# Patient Record
Sex: Male | Born: 1950 | Race: White | Hispanic: No | Marital: Married | Smoking: Never smoker
Health system: Southern US, Community
[De-identification: ages and names within clinical notes are randomized; demographics above are authoritative.]

## PROBLEM LIST (undated history)

## (undated) DIAGNOSIS — Z87442 Personal history of urinary calculi: Secondary | ICD-10-CM

## (undated) DIAGNOSIS — K219 Gastro-esophageal reflux disease without esophagitis: Secondary | ICD-10-CM

## (undated) DIAGNOSIS — C439 Malignant melanoma of skin, unspecified: Secondary | ICD-10-CM

## (undated) DIAGNOSIS — G4700 Insomnia, unspecified: Secondary | ICD-10-CM

## (undated) DIAGNOSIS — N209 Urinary calculus, unspecified: Secondary | ICD-10-CM

## (undated) DIAGNOSIS — K5730 Diverticulosis of large intestine without perforation or abscess without bleeding: Secondary | ICD-10-CM

## (undated) DIAGNOSIS — N200 Calculus of kidney: Secondary | ICD-10-CM

## (undated) DIAGNOSIS — H269 Unspecified cataract: Secondary | ICD-10-CM

## (undated) DIAGNOSIS — N201 Calculus of ureter: Secondary | ICD-10-CM

## (undated) HISTORY — DX: Malignant melanoma of skin, unspecified: C43.9

## (undated) HISTORY — DX: Urinary calculus, unspecified: N20.9

## (undated) HISTORY — DX: Unspecified cataract: H26.9

## (undated) HISTORY — DX: Diverticulosis of large intestine without perforation or abscess without bleeding: K57.30

## (undated) HISTORY — PX: MELANOMA EXCISION: SHX5266

## (undated) HISTORY — DX: Gastro-esophageal reflux disease without esophagitis: K21.9

## (undated) HISTORY — PX: COLONOSCOPY: SHX174

## (undated) HISTORY — DX: Calculus of kidney: N20.0

## (undated) HISTORY — PX: NOSE SURGERY: SHX723

## (undated) HISTORY — DX: Insomnia, unspecified: G47.00

## (undated) HISTORY — DX: Calculus of ureter: N20.1

## (undated) HISTORY — DX: Personal history of urinary calculi: Z87.442

---

## 2003-06-09 DIAGNOSIS — C439 Malignant melanoma of skin, unspecified: Secondary | ICD-10-CM

## 2003-06-09 HISTORY — DX: Malignant melanoma of skin, unspecified: C43.9

## 2004-06-16 ENCOUNTER — Ambulatory Visit: Payer: Self-pay | Admitting: Internal Medicine

## 2004-12-12 ENCOUNTER — Ambulatory Visit: Payer: Self-pay | Admitting: Internal Medicine

## 2004-12-25 ENCOUNTER — Ambulatory Visit: Payer: Self-pay | Admitting: Internal Medicine

## 2005-01-09 ENCOUNTER — Ambulatory Visit: Payer: Self-pay | Admitting: Internal Medicine

## 2005-01-09 ENCOUNTER — Encounter: Payer: Self-pay | Admitting: Internal Medicine

## 2005-02-10 ENCOUNTER — Ambulatory Visit: Payer: Self-pay | Admitting: Internal Medicine

## 2007-01-14 ENCOUNTER — Ambulatory Visit: Payer: Self-pay | Admitting: Internal Medicine

## 2007-01-17 ENCOUNTER — Ambulatory Visit: Payer: Self-pay | Admitting: Internal Medicine

## 2007-01-17 LAB — CONVERTED CEMR LAB
AST: 21 units/L (ref 0–37)
Albumin: 4.1 g/dL (ref 3.5–5.2)
Basophils Absolute: 0 10*3/uL (ref 0.0–0.1)
Bilirubin Urine: NEGATIVE
Bilirubin, Direct: 0.1 mg/dL (ref 0.0–0.3)
Blood in Urine, dipstick: NEGATIVE
Calcium: 9.2 mg/dL (ref 8.4–10.5)
Chloride: 106 meq/L (ref 96–112)
Cholesterol: 188 mg/dL (ref 0–200)
Eosinophils Absolute: 0.1 10*3/uL (ref 0.0–0.6)
GFR calc Af Amer: 89 mL/min
GFR calc non Af Amer: 74 mL/min
Glucose, Urine, Semiquant: NEGATIVE
Ketones, urine, test strip: NEGATIVE
Lymphocytes Relative: 39.4 % (ref 12.0–46.0)
MCHC: 34.5 g/dL (ref 30.0–36.0)
MCV: 90.9 fL (ref 78.0–100.0)
Neutro Abs: 2.6 10*3/uL (ref 1.4–7.7)
Neutrophils Relative %: 48.2 % (ref 43.0–77.0)
PSA: 0.48 ng/mL (ref 0.10–4.00)
Platelets: 199 10*3/uL (ref 150–400)
Protein, U semiquant: NEGATIVE
RBC: 4.83 M/uL (ref 4.22–5.81)
Sodium: 142 meq/L (ref 135–145)
TSH: 2 microintl units/mL (ref 0.35–5.50)
Total CHOL/HDL Ratio: 3.1
Triglycerides: 54 mg/dL (ref 0–149)
pH: 6

## 2007-01-28 ENCOUNTER — Ambulatory Visit: Payer: Self-pay | Admitting: Internal Medicine

## 2007-01-28 DIAGNOSIS — K219 Gastro-esophageal reflux disease without esophagitis: Secondary | ICD-10-CM | POA: Insufficient documentation

## 2007-01-28 HISTORY — DX: Gastro-esophageal reflux disease without esophagitis: K21.9

## 2007-02-02 DIAGNOSIS — K5730 Diverticulosis of large intestine without perforation or abscess without bleeding: Secondary | ICD-10-CM | POA: Insufficient documentation

## 2007-02-02 HISTORY — DX: Diverticulosis of large intestine without perforation or abscess without bleeding: K57.30

## 2007-02-04 ENCOUNTER — Telehealth: Payer: Self-pay | Admitting: Internal Medicine

## 2007-06-17 ENCOUNTER — Telehealth: Payer: Self-pay | Admitting: Internal Medicine

## 2007-10-21 ENCOUNTER — Ambulatory Visit: Payer: Self-pay | Admitting: Internal Medicine

## 2007-10-21 LAB — CONVERTED CEMR LAB
ALT: 14 units/L (ref 0–53)
Basophils Absolute: 0 10*3/uL (ref 0.0–0.1)
Basophils Relative: 0.5 % (ref 0.0–1.0)
Bilirubin Urine: NEGATIVE
Bilirubin, Direct: 0.1 mg/dL (ref 0.0–0.3)
CO2: 30 meq/L (ref 19–32)
Calcium: 9.8 mg/dL (ref 8.4–10.5)
Cholesterol: 192 mg/dL (ref 0–200)
Creatinine, Ser: 1.1 mg/dL (ref 0.4–1.5)
GFR calc Af Amer: 89 mL/min
Glucose, Urine, Semiquant: NEGATIVE
HCT: 47.6 % (ref 39.0–52.0)
Hemoglobin: 15.6 g/dL (ref 13.0–17.0)
Lymphocytes Relative: 32.5 % (ref 12.0–46.0)
MCHC: 32.7 g/dL (ref 30.0–36.0)
MCV: 90.5 fL (ref 78.0–100.0)
Monocytes Absolute: 0.5 10*3/uL (ref 0.1–1.0)
Neutro Abs: 2.5 10*3/uL (ref 1.4–7.7)
PSA: 0.42 ng/mL (ref 0.10–4.00)
RBC: 5.27 M/uL (ref 4.22–5.81)
RDW: 15.1 % — ABNORMAL HIGH (ref 11.5–14.6)
Sodium: 142 meq/L (ref 135–145)
Specific Gravity, Urine: 1.025
TSH: 2.06 microintl units/mL (ref 0.35–5.50)
Total Bilirubin: 1.8 mg/dL — ABNORMAL HIGH (ref 0.3–1.2)
Total Protein: 6.8 g/dL (ref 6.0–8.3)
Triglycerides: 69 mg/dL (ref 0–149)
Urobilinogen, UA: 0.2
pH: 6

## 2007-11-18 ENCOUNTER — Ambulatory Visit: Payer: Self-pay | Admitting: Internal Medicine

## 2007-11-18 DIAGNOSIS — M25519 Pain in unspecified shoulder: Secondary | ICD-10-CM | POA: Insufficient documentation

## 2008-04-19 ENCOUNTER — Ambulatory Visit: Payer: Self-pay | Admitting: Cardiology

## 2008-04-19 ENCOUNTER — Ambulatory Visit: Payer: Self-pay | Admitting: Family Medicine

## 2008-04-19 ENCOUNTER — Telehealth: Payer: Self-pay | Admitting: Family Medicine

## 2008-04-19 DIAGNOSIS — N201 Calculus of ureter: Secondary | ICD-10-CM | POA: Insufficient documentation

## 2008-04-19 DIAGNOSIS — N200 Calculus of kidney: Secondary | ICD-10-CM | POA: Insufficient documentation

## 2008-04-19 DIAGNOSIS — N209 Urinary calculus, unspecified: Secondary | ICD-10-CM

## 2008-04-19 HISTORY — DX: Urinary calculus, unspecified: N20.9

## 2008-04-19 HISTORY — DX: Calculus of ureter: N20.1

## 2008-04-19 HISTORY — DX: Calculus of kidney: N20.0

## 2008-04-25 ENCOUNTER — Telehealth: Payer: Self-pay | Admitting: Internal Medicine

## 2008-07-16 ENCOUNTER — Telehealth: Payer: Self-pay | Admitting: Internal Medicine

## 2008-12-11 ENCOUNTER — Ambulatory Visit: Payer: Self-pay | Admitting: Internal Medicine

## 2008-12-11 LAB — CONVERTED CEMR LAB
ALT: 15 units/L (ref 0–53)
AST: 23 units/L (ref 0–37)
Alkaline Phosphatase: 35 units/L — ABNORMAL LOW (ref 39–117)
Basophils Absolute: 0 10*3/uL (ref 0.0–0.1)
Bilirubin, Direct: 0.1 mg/dL (ref 0.0–0.3)
CO2: 30 meq/L (ref 19–32)
Calcium: 9.6 mg/dL (ref 8.4–10.5)
Chloride: 107 meq/L (ref 96–112)
Direct LDL: 123.6 mg/dL
Eosinophils Absolute: 0.1 10*3/uL (ref 0.0–0.7)
Hemoglobin: 15.9 g/dL (ref 13.0–17.0)
Lymphocytes Relative: 37.9 % (ref 12.0–46.0)
MCHC: 34.8 g/dL (ref 30.0–36.0)
Neutro Abs: 2.3 10*3/uL (ref 1.4–7.7)
Nitrite: NEGATIVE
Platelets: 197 10*3/uL (ref 150.0–400.0)
Potassium: 4.4 meq/L (ref 3.5–5.1)
RDW: 12.2 % (ref 11.5–14.6)
Sodium: 143 meq/L (ref 135–145)
Specific Gravity, Urine: >=1.03
Total CHOL/HDL Ratio: 3
Total Protein: 6.8 g/dL (ref 6.0–8.3)
Urobilinogen, UA: 0.2
WBC Urine, dipstick: NEGATIVE

## 2008-12-28 ENCOUNTER — Ambulatory Visit: Payer: Self-pay | Admitting: Internal Medicine

## 2008-12-28 DIAGNOSIS — Z87442 Personal history of urinary calculi: Secondary | ICD-10-CM

## 2008-12-28 HISTORY — DX: Personal history of urinary calculi: Z87.442

## 2008-12-31 ENCOUNTER — Telehealth: Payer: Self-pay | Admitting: Internal Medicine

## 2009-12-11 ENCOUNTER — Encounter (INDEPENDENT_AMBULATORY_CARE_PROVIDER_SITE_OTHER): Payer: Self-pay | Admitting: *Deleted

## 2010-02-14 ENCOUNTER — Ambulatory Visit: Payer: Self-pay | Admitting: Internal Medicine

## 2010-02-14 LAB — CONVERTED CEMR LAB
ALT: 20 units/L (ref 0–53)
AST: 22 units/L (ref 0–37)
Albumin: 4.2 g/dL (ref 3.5–5.2)
Blood in Urine, dipstick: NEGATIVE
Eosinophils Relative: 1.1 % (ref 0.0–5.0)
GFR calc non Af Amer: 62.93 mL/min (ref >60)
HCT: 47.8 % (ref 39.0–52.0)
HDL: 60.9 mg/dL (ref >39.00)
Hemoglobin: 16.3 g/dL (ref 13.0–17.0)
Lymphs Abs: 2.2 10*3/uL (ref 0.7–4.0)
Monocytes Relative: 8.5 % (ref 3.0–12.0)
Neutro Abs: 2.7 10*3/uL (ref 1.4–7.7)
Nitrite: NEGATIVE
Potassium: 4.4 meq/L (ref 3.5–5.1)
Protein, U semiquant: NEGATIVE
Sodium: 143 meq/L (ref 135–145)
Specific Gravity, Urine: 1.025
TSH: 1.57 microintl units/mL (ref 0.35–5.50)
Total Bilirubin: 1.5 mg/dL — ABNORMAL HIGH (ref 0.3–1.2)
VLDL: 12.6 mg/dL (ref 0.0–40.0)
WBC Urine, dipstick: NEGATIVE
WBC: 5.4 10*3/uL (ref 4.5–10.5)

## 2010-02-28 ENCOUNTER — Ambulatory Visit: Payer: Self-pay | Admitting: Internal Medicine

## 2010-05-05 ENCOUNTER — Encounter (INDEPENDENT_AMBULATORY_CARE_PROVIDER_SITE_OTHER): Payer: Self-pay | Admitting: *Deleted

## 2010-06-19 ENCOUNTER — Encounter (INDEPENDENT_AMBULATORY_CARE_PROVIDER_SITE_OTHER): Payer: Self-pay | Admitting: *Deleted

## 2010-06-20 ENCOUNTER — Ambulatory Visit
Admission: RE | Admit: 2010-06-20 | Discharge: 2010-06-20 | Payer: Self-pay | Source: Home / Self Care | Attending: Internal Medicine | Admitting: Internal Medicine

## 2010-07-04 ENCOUNTER — Ambulatory Visit: Admit: 2010-07-04 | Payer: Self-pay | Admitting: Internal Medicine

## 2010-07-06 LAB — CONVERTED CEMR LAB
Glucose, Urine, Semiquant: NEGATIVE
Ketones, urine, test strip: NEGATIVE
Nitrite: NEGATIVE
WBC Urine, dipstick: NEGATIVE
pH: 7

## 2010-07-08 NOTE — Letter (Signed)
Summary: Colonoscopy Letter  [facility]  [address] [place], [place] [address]   Phone: [phone]  Fax: [phone]      December 11, 2009 MRN: [id number]   Robert Cowan [address] [place], [place]  [address]   Dear [name],   According to your medical record, it is time for you to schedule a Colonoscopy. The American Cancer Society recommends this procedure as a method to detect early colon cancer. Patients with a family history of colon cancer, or a personal history of colon polyps or inflammatory bowel disease are at increased risk.  This letter has beeen generated based on the recommendations made at the time of your procedure. If you feel that in your particular situation this may no longer apply, please contact our office.  Please call our office at [phone] to schedule this appointment or to update your records at your earliest convenience.  Thank you for cooperating with [place] to provide you with the very best care possible.   Sincerely,  Hedwig Morton. Juanda Chance, M.D.  [facility] [phone]

## 2010-07-08 NOTE — Letter (Signed)
Summary: Pre Visit Letter Revised  [facility]  [address] [place], [place] [address]   Phone: [phone]  Fax: [phone]        05/05/2010 MRN: [id number]   Robert Cowan [address] [place], [place]  [address]            Procedure Date:  July 04, 2010  Welcome to the Gastroenterology Division at [facility].    You are scheduled to see a nurse for your pre-procedure visit on June 20, 2010 at 8:00 A.M. on the 3rd floor at [facility], [address]  We ask that you try to arrive at our office 15 minutes prior to your appointment time to allow for check-in.  Please take a minute to review the attached form.  If you answer "Yes" to one or more of the questions on the first page, we ask that you call the person listed at your earliest opportunity.  If you answer "No" to all of the questions, please complete the rest of the form and bring it to your appointment.    Your nurse visit will consist of discussing your medical and surgical history, your immediate family medical history, and your medications.   If you are unable to list all of your medications on the form, please bring the medication bottles to your appointment and we will list them.  We will need to be aware of both prescribed and over the counter drugs.  We will need to know exact dosage information as well.    Please be prepared to read and sign documents such as consent forms, a financial agreement, and acknowledgement forms.  If necessary, and with your consent, a friend or relative is welcome to sit-in on the nurse visit with you.  Please bring your insurance card so that we may make a copy of it.  If your insurance requires a referral to see a specialist, please bring your referral form from your primary care physician.  No co-pay is required for this nurse visit.     If you cannot keep your appointment, please call [phone] to cancel or reschedule prior to your appointment  date.  This allows [place] the opportunity to schedule an appointment for another patient in need of care.    Thank you for choosing [facility] for your medical needs.  We appreciate the opportunity to care for you.  Please visit [place] at our website  to learn more about our practice.  Sincerely, The Gastroenterology Division

## 2010-07-08 NOTE — Assessment & Plan Note (Signed)
Summary: CPX/CJR   Vital Signs:  Patient profile:   60 year old male Height:      68.5 inches Weight:      179 pounds BMI:     26.92 Temp:     98.2 degrees F oral BP sitting:   110 / 70  (right arm) Cuff size:   regular  Vitals Entered By: Duard Brady LPN (February 28, 2010 1:11 PM) CC: cpx - doing well has medication questions Is Patient Diabetic? No   CC:  cpx - doing well has medication questions.  History of Present Illness: 60 year old patient who is seen today for a wellness exam.  Medical problems include nephrolithiasis diverticulosis and gastroesophageal reflux disease.  He has chronic insomnia.  He has been using Tylenol PM for some time with diminishing effectiveness.  He is scheduled for follow-up colonoscopy in the near future  Allergies: 1)  ! Tetracycline Hcl (Tetracycline Hcl)  Past History:  Past Medical History: GERD Diverticulosis, colon ED Melanoma Nephrolithiasis, hx of Insomnia family history of colon cancer  Past Surgical History: Reviewed history from 11/18/2007 and no changes required. Mohs surgery for melanoma in situ, left Malar area, March 2006 resection dysplastic nevus, right anterior chest wall, June 2009  Colonoscopy August 2006  Family History: Reviewed history from 11/18/2007 and no changes required. mother died at 11 of complications of colon cancer father died of suicide death of several years later.  two brothers are well  Social History: Reviewed history from 12/28/2008 and no changes required. Single Regular exercise-yes  Review of Systems  The patient denies anorexia, fever, weight loss, weight gain, vision loss, decreased hearing, hoarseness, chest pain, syncope, dyspnea on exertion, peripheral edema, prolonged cough, headaches, hemoptysis, abdominal pain, melena, hematochezia, severe indigestion/heartburn, hematuria, incontinence, genital sores, muscle weakness, suspicious skin lesions, transient blindness,  difficulty walking, depression, unusual weight change, abnormal bleeding, enlarged lymph nodes, angioedema, breast masses, and testicular masses.    Physical Exam  General:  Well-developed,well-nourished,in no acute distress; alert,appropriate and cooperative throughout examination Head:  Normocephalic and atraumatic without obvious abnormalities. No apparent alopecia or balding. Eyes:  No corneal or conjunctival inflammation noted. EOMI. Perrla. Funduscopic exam benign, without hemorrhages, exudates or papilledema. Vision grossly normal. Ears:  External ear exam shows no significant lesions or deformities.  Otoscopic examination reveals clear canals, tympanic membranes are intact bilaterally without bulging, retraction, inflammation or discharge. Hearing is grossly normal bilaterally. Nose:  External nasal examination shows no deformity or inflammation. Nasal mucosa are pink and moist without lesions or exudates. Mouth:  Oral mucosa and oropharynx without lesions or exudates.  Teeth in good repair. Neck:  No deformities, masses, or tenderness noted. Chest Wall:  No deformities, masses, tenderness or gynecomastia noted. Lungs:  Normal respiratory effort, chest expands symmetrically. Lungs are clear to auscultation, no crackles or wheezes. Heart:  Normal rate and regular rhythm. S1 and S2 normal without gallop, murmur, click, rub or other extra sounds. Abdomen:  Bowel sounds positive,abdomen soft and non-tender without masses, organomegaly or hernias noted. Rectal:  No external abnormalities noted. Normal sphincter tone. No rectal masses or tenderness. Genitalia:  Testes bilaterally descended without nodularity, tenderness or masses. No scrotal masses or lesions. No penis lesions or urethral discharge. Prostate:  Prostate gland firm and smooth, no enlargement, nodularity, tenderness, mass, asymmetry or induration. Msk:  No deformity or scoliosis noted of thoracic or lumbar spine.   Pulses:  R and L  carotid,radial,femoral,dorsalis pedis and posterior tibial pulses are full and equal bilaterally Extremities:  No clubbing, cyanosis, edema, or deformity noted with normal full range of motion of all joints.   Neurologic:  No cranial nerve deficits noted. Station and gait are normal. Plantar reflexes are down-going bilaterally. DTRs are symmetrical throughout. Sensory, motor and coordinative functions appear intact. Skin:  Intact without suspicious lesions or rashes Cervical Nodes:  No lymphadenopathy noted Axillary Nodes:  No palpable lymphadenopathy Inguinal Nodes:  No significant adenopathy Psych:  Cognition and judgment appear intact. Alert and cooperative with normal attention span and concentration. No apparent delusions, illusions, hallucinations   Impression & Recommendations:  Problem # 1:  PREVENTIVE HEALTH CARE (ICD-V70.0)  Complete Medication List: 1)  Tylenol Pm Extra Strength 500-25 Mg Tabs (Diphenhydramine-apap (sleep)) .... Take 1 tablet by mouth at bedtime 2)  Aspirin 325 Mg Tbec (Aspirin) .... Once daily 3)  Multivitamins Caps (Multiple vitamin) .... Once daily 4)  Nexium 40 Mg Cpdr (Esomeprazole magnesium) .Marland Kitchen.. 1 once daily 5)  Cialis 20 Mg Tabs (Tadalafil) 6)  Acyclovir 200 Mg Caps (Acyclovir) .... As dir 7)  Lorazepam 0.5 Mg Tabs (Lorazepam) .... One at bedtime as needed for sleep  Patient Instructions: 1)  Please schedule a follow-up appointment in 1 year. 2)  Avoid foods high in acid (tomatoes, citrus juices, spicy foods). Avoid eating within two hours of lying down or before exercising. Do not over eat; try smaller more frequent meals. Elevate head of bed twelve inches when sleeping. 3)  It is important that you exercise regularly at least 20 minutes 5 times a week. If you develop chest pain, have severe difficulty breathing, or feel very tired , stop exercising immediately and seek medical attention. Prescriptions: LORAZEPAM 0.5 MG TABS (LORAZEPAM) one at bedtime  as needed for sleep  #60 x 4   Entered and Authorized by:   Gordy Savers  MD   Signed by:   Gordy Savers  MD on 02/28/2010   Method used:   Print then Give to Patient   RxID:   [id number] ACYCLOVIR 200 MG  CAPS (ACYCLOVIR) as dir  #90 x 6   Entered and Authorized by:   Gordy Savers  MD   Signed by:   Gordy Savers  MD on 02/28/2010   Method used:   [id number]       [address]       [place], [place]  [address]       Ph: [phone] or [phone]       Fax: [phone]   RxID:   [id number] CIALIS 20 MG  TABS (TADALAFIL)   #18 x 6   Entered and Authorized by:   Gordy Savers  MD   Signed by:   Gordy Savers  MD on 02/28/2010   Method used:   Print then Give to Patient   RxID:   [id number] NEXIUM 40 MG  CPDR (ESOMEPRAZOLE MAGNESIUM) 1 once daily  #90 x 6   Entered and Authorized by:   Gordy Savers  MD   Signed by:   Gordy Savers  MD on 02/28/2010   Method used:   [facility]        [id number]       [address]       [place], [place]  [address]  Ph: [phone] or [phone]       Fax: [phone]   RxID:   [id number]

## 2010-07-10 NOTE — Letter (Signed)
Summary: [facility] Instructions  [facility]  [address] [place], [place] [address]   Phone: [phone]  Fax: [phone]       Robert Cowan    August 16, 1950    MRN: [id number]       Procedure Day /Date:  Friday 07/04/2010     Arrival Time: 7:30 am     Procedure Time: 8:00 am     Location of Procedure:                    _x _   [facility] (4th Floor)   PREPARATION FOR COLONOSCOPY WITH MIRALAX  Starting 5 days prior to your procedure Sunday 1/22 do not eat nuts, seeds, popcorn, corn, beans, peas,  salads, or any raw vegetables.  Do not take any fiber supplements (e.g. Metamucil, Citrucel, and Benefiber). ____________________________________________________________________________________________________   THE DAY BEFORE YOUR PROCEDURE         DATE: Thursday 1/26  1   Drink clear liquids the entire day-NO SOLID FOOD  2   Do not drink anything colored red or purple.  Avoid juices with pulp.  No orange juice.  3   Drink at least 64 oz. (8 glasses) of fluid/clear liquids during the day to prevent dehydration and help the prep work efficiently.  CLEAR LIQUIDS INCLUDE: Water Jello Ice Popsicles Tea (sugar ok, no milk/cream) Powdered fruit flavored drinks Coffee (sugar ok, no milk/cream) Gatorade Juice: apple, white grape, white cranberry  Lemonade Clear bullion, consomm, broth Carbonated beverages (any kind) Strained chicken noodle soup Hard Candy  4   Mix the entire bottle of Miralax with 64 oz. of Gatorade/Powerade in the morning and put in the refrigerator to chill.  5   At 3:00 pm take 2 Dulcolax/Bisacodyl tablets.  6   At 4:30 pm take one Reglan/Metoclopramide tablet.  7  Starting at 5:00 pm drink one 8 oz glass of the Miralax mixture every 15-20 minutes until you have finished drinking the entire 64 oz.  You should finish drinking prep around 7:30 or 8:00 pm.  8   If you are nauseated, you may take the 2nd Reglan/Metoclopramide tablet  at 6:30 pm.        9    At 8:00 pm take 2 more DULCOLAX/Bisacodyl tablets.     THE DAY OF YOUR PROCEDURE      DATE:  Friday 1/27 You may drink clear liquids until 6:00 am  (2 HOURS BEFORE PROCEDURE).   MEDICATION INSTRUCTIONS  Unless otherwise instructed, you should take regular prescription medications with a small sip of water as early as possible the morning of your procedure.         OTHER INSTRUCTIONS  You will need a responsible adult at least 59 years of age to accompany you and drive you home.   This person must remain in the waiting room during your procedure.  Wear loose fitting clothing that is easily removed.  Leave jewelry and other valuables at home.  However, you may wish to bring a book to read or an iPod/MP3 player to listen to music as you wait for your procedure to start.  Remove all body piercing jewelry and leave at home.  Total time from sign-in until discharge is approximately 2-3 hours.  You should go home directly after your procedure and rest.  You can resume normal activities the day after your procedure.  The day of your procedure you should not:   Drive   Make legal decisions  Operate machinery   Drink alcohol   Return to work  You will receive specific instructions about eating, activities and medications before you leave.   The above instructions have been reviewed and explained to me by  Sherren Kerns RN  June 20, 2010 8:00 AM      I fully understand and can verbalize these instructions _____________________________ Date _______

## 2010-07-10 NOTE — Miscellaneous (Signed)
Summary: previsit prep/rm  Clinical Lists Changes  Medications: Added new medication of MIRALAX   POWD (POLYETHYLENE GLYCOL 3350) As per prep  instructions. - Signed Added new medication of DULCOLAX 5 MG  TBEC (BISACODYL) Day before procedure take 2 at 3pm and 2 at 8pm. - Signed Added new medication of REGLAN 10 MG  TABS (METOCLOPRAMIDE HCL) As per prep instructions. - Signed Rx of MIRALAX   POWD (POLYETHYLENE GLYCOL 3350) As per prep  instructions.;  #255gm x 0;  Signed;  Entered by: Sherren Kerns RN;  Authorized by: Hart Carwin MD;  Method used: [id number] [address] [place], [place]  [address], Ph: [phone] or [phone], Fax: [phone] Rx of DULCOLAX 5 MG  TBEC (BISACODYL) Day before procedure take 2 at 3pm and 2 at 8pm.;  #4 x 0;  Signed;  Entered by: Sherren Kerns RN;  Authorized by: Hart Carwin MD;  Method used: [facility] [id number] [address], [place], [place]  [address], Ph: [phone] or [phone], Fax: [phone] Rx of REGLAN 10 MG  TABS (METOCLOPRAMIDE HCL) As per prep instructions.;  #2 x 0;  Signed;  Entered by: Sherren Kerns RN;  Authorized by: Hart Carwin MD;  Method used: [id number] [address] [place], [place]  [address], Ph: [phone] or [phone], Fax: [phone] Observations: Added new observation of ALLERGY REV: Done (06/20/2010 7:44)    Prescriptions: REGLAN 10 MG  TABS (METOCLOPRAMIDE HCL) As per prep instructions.  #2 x 0   Entered by:   Sherren Kerns RN   Authorized by:   Hart Carwin MD   Signed by:   Sherren Kerns RN on 06/20/2010   Method used:   [facility]        [id number]       [address]       [place], [place]  [address]       Ph: [phone] or [phone]       Fax: [phone]   RxID:   [id number] DULCOLAX 5 MG  TBEC (BISACODYL) Day before  procedure take 2 at 3pm and 2 at 8pm.  #4 x 0   Entered by:   Sherren Kerns RN   Authorized by:   Hart Carwin MD   Signed by:   Sherren Kerns RN on 06/20/2010   Method used:   [facility]        [id number]       [address]       [place], [place]  [address]       Ph: [phone] or [phone]       Fax: [phone]   RxID:   [id number] MIRALAX   POWD (POLYETHYLENE GLYCOL 3350) As per prep  instructions.  #255gm x 0   Entered by:   Sherren Kerns RN   Authorized by:   Hart Carwin MD   Signed by:   Sherren Kerns RN on 06/20/2010   Method used:   [facility]        [id number]       [address]       [place], [place]  [address]  Ph: [phone] or [phone]       Fax: [phone]   RxID:   [id number]

## 2010-10-03 ENCOUNTER — Other Ambulatory Visit: Payer: Self-pay | Admitting: Internal Medicine

## 2010-10-03 MED ORDER — LORAZEPAM 0.5 MG PO TABS: 0.5000 mg | ORAL_TABLET | Freq: Every evening | ORAL | Status: DC | PRN

## 2010-10-03 NOTE — Telephone Encounter (Signed)
Pt needs refill lorazepam .5mg  [facility] [address] [phone]. Pt needs med by this Sunday 10-05-2010.

## 2010-10-03 NOTE — Telephone Encounter (Signed)
Called into doctor's line at [facility]

## 2010-10-30 ENCOUNTER — Ambulatory Visit (AMBULATORY_SURGERY_CENTER): Payer: No Typology Code available for payment source | Admitting: *Deleted

## 2010-10-30 VITALS — Ht 69.0 in | Wt 180.0 lb

## 2010-10-30 DIAGNOSIS — Z800 Family history of malignant neoplasm of digestive organs: Secondary | ICD-10-CM

## 2010-11-13 ENCOUNTER — Ambulatory Visit (AMBULATORY_SURGERY_CENTER): Payer: No Typology Code available for payment source | Admitting: Internal Medicine

## 2010-11-13 ENCOUNTER — Encounter: Payer: Self-pay | Admitting: Internal Medicine

## 2010-11-13 VITALS — BP 133/86 | HR 73 | Temp 96.9°F | Resp 17 | Ht 69.0 in | Wt 172.0 lb

## 2010-11-13 DIAGNOSIS — Z800 Family history of malignant neoplasm of digestive organs: Secondary | ICD-10-CM

## 2010-11-13 DIAGNOSIS — Z1211 Encounter for screening for malignant neoplasm of colon: Secondary | ICD-10-CM

## 2010-11-13 DIAGNOSIS — K5730 Diverticulosis of large intestine without perforation or abscess without bleeding: Secondary | ICD-10-CM

## 2010-11-13 MED ORDER — SODIUM CHLORIDE 0.9 % IV SOLN: 500.0000 mL | INTRAVENOUS | Status: DC

## 2010-11-13 NOTE — Progress Notes (Signed)
Pressure applied to abdomen to reach cecum.  

## 2010-11-14 ENCOUNTER — Telehealth: Payer: Self-pay

## 2010-11-14 NOTE — Telephone Encounter (Signed)

## 2011-01-26 ENCOUNTER — Other Ambulatory Visit: Payer: Self-pay | Admitting: Internal Medicine

## 2011-01-26 MED ORDER — LORAZEPAM 0.5 MG PO TABS: 0.5000 mg | ORAL_TABLET | Freq: Every evening | ORAL | Status: DC | PRN

## 2011-01-26 NOTE — Telephone Encounter (Signed)
Pt req refill for LORazepam (ATIVAN) 0.5 MG tablet . Pls call in to [facility] [address]

## 2011-01-26 NOTE — Telephone Encounter (Signed)
[facility] will need to be seen for future refills - last seen 02/2010

## 2011-02-24 ENCOUNTER — Other Ambulatory Visit (INDEPENDENT_AMBULATORY_CARE_PROVIDER_SITE_OTHER): Payer: No Typology Code available for payment source

## 2011-02-24 DIAGNOSIS — Z0000 Encounter for general adult medical examination without abnormal findings: Secondary | ICD-10-CM

## 2011-02-24 LAB — PSA: PSA: 0.6 ng/mL (ref 0.10–4.00)

## 2011-02-24 LAB — POCT URINALYSIS DIPSTICK
Bilirubin, UA: NEGATIVE
Blood, UA: NEGATIVE
Glucose, UA: NEGATIVE
Ketones, UA: NEGATIVE
Leukocytes, UA: NEGATIVE
Nitrite, UA: NEGATIVE
Protein, UA: NEGATIVE
Spec Grav, UA: 1.025
Urobilinogen, UA: 0.2
pH, UA: 6

## 2011-02-24 LAB — BASIC METABOLIC PANEL
GFR: 71.92 mL/min (ref >60.00)
Glucose, Bld: 98 mg/dL (ref 70–99)
Potassium: 4.5 mEq/L (ref 3.5–5.1)
Sodium: 142 mEq/L (ref 135–145)

## 2011-02-24 LAB — HEPATIC FUNCTION PANEL
ALT: 20 U/L (ref 0–53)
AST: 26 U/L (ref 0–37)
Albumin: 4.2 g/dL (ref 3.5–5.2)
Alkaline Phosphatase: 49 U/L (ref 39–117)
Bilirubin, Direct: 0.2 mg/dL (ref 0.0–0.3)
Total Bilirubin: 1.2 mg/dL (ref 0.3–1.2)
Total Protein: 6.7 g/dL (ref 6.0–8.3)

## 2011-02-24 LAB — CBC WITH DIFFERENTIAL/PLATELET
Basophils Relative: 0.6 % (ref 0.0–3.0)
Eosinophils Relative: 1.4 % (ref 0.0–5.0)
HCT: 48.4 % (ref 39.0–52.0)
Hemoglobin: 16.1 g/dL (ref 13.0–17.0)
Lymphs Abs: 2 10*3/uL (ref 0.7–4.0)
Monocytes Relative: 8.6 % (ref 3.0–12.0)
Neutro Abs: 2.5 10*3/uL (ref 1.4–7.7)
RBC: 5.18 Mil/uL (ref 4.22–5.81)
WBC: 5 10*3/uL (ref 4.5–10.5)

## 2011-02-24 LAB — LIPID PANEL
Cholesterol: 216 mg/dL — ABNORMAL HIGH (ref 0–200)
HDL: 62 mg/dL
Total CHOL/HDL Ratio: 3
Triglycerides: 66 mg/dL (ref 0.0–149.0)
VLDL: 13.2 mg/dL (ref 0.0–40.0)

## 2011-02-24 LAB — TSH: TSH: 1.9 u[IU]/mL (ref 0.35–5.50)

## 2011-02-24 LAB — LDL CHOLESTEROL, DIRECT: Direct LDL: 136.2 mg/dL

## 2011-02-27 ENCOUNTER — Telehealth: Payer: Self-pay | Admitting: *Deleted

## 2011-02-27 NOTE — Telephone Encounter (Signed)
Pt has had 2 days of fatigue and low grade fever.  Appt to see Dr. Kirtland Bouchard next week for CPX.   Spoke to pt, and he prefers to wait until next week to see Dr. Kirtland Bouchard unless he becomes worse.  Basically, his complaints are fatigue, ? Low grade fever, and body aches.  Will call the Saturday clinic if he feels worse tomorrow.

## 2011-03-02 ENCOUNTER — Encounter: Payer: Self-pay | Admitting: Internal Medicine

## 2011-03-02 NOTE — Telephone Encounter (Signed)
noted 

## 2011-03-03 ENCOUNTER — Ambulatory Visit (INDEPENDENT_AMBULATORY_CARE_PROVIDER_SITE_OTHER): Payer: No Typology Code available for payment source | Admitting: Internal Medicine

## 2011-03-03 ENCOUNTER — Encounter: Payer: Self-pay | Admitting: Internal Medicine

## 2011-03-03 VITALS — BP 112/70 | HR 70 | Temp 98.1°F | Resp 16 | Ht 69.5 in | Wt 183.0 lb

## 2011-03-03 DIAGNOSIS — Z0000 Encounter for general adult medical examination without abnormal findings: Secondary | ICD-10-CM

## 2011-03-03 MED ORDER — LORAZEPAM 0.5 MG PO TABS: 0.5000 mg | ORAL_TABLET | Freq: Every evening | ORAL | Status: DC | PRN

## 2011-03-03 NOTE — Patient Instructions (Signed)
It is important that you exercise regularly, at least 20 minutes 3 to 4 times per week.  If you develop chest pain or shortness of breath seek  medical attention.  Return in one year for follow-up   

## 2011-03-03 NOTE — Progress Notes (Signed)
Subjective:    Patient ID: Robert Cowan, male    DOB: 05/26/1951, 60 y.o.   MRN: [id number]  HPI  60 year old patient who is seen today for a preventive health examination. He has a history of mild diverticular disease and family history of colon cancer. He has had a followup colonoscopy in June of this year. He has a history of insomnia gastroesophageal reflux disease and has done quite well. He also has a history of nephrolithiasis which has been asymptomatic. No other concerns or complaints. Laboratory studies were reviewed  Allergies:  1) ! Tetracycline Hcl (Tetracycline Hcl)   Past History:  Past Medical History:  GERD  Diverticulosis, colon  ED  Melanoma  Nephrolithiasis, hx of  Insomnia  family history of colon cancer   Past Surgical History:   Reviewed history from 11/18/2007 and no changes required.  Mohs surgery for melanoma in situ, left Malar area, March 2006  resection dysplastic nevus, right anterior chest wall, June 2009  Colonoscopy August 2006  2012   Family History:  Reviewed history from 11/18/2007 and no changes required.  mother died at 68 of complications of colon cancer  father died of suicide death of several years later.  two brothers are well   Social History:  Reviewed history from 12/28/2008 and no changes required.  Single  Regular exercise-yes     Review of Systems  Constitutional: Negative for fever, chills, activity change, appetite change and fatigue.  HENT: Negative for hearing loss, ear pain, congestion, rhinorrhea, sneezing, mouth sores, trouble swallowing, neck pain, neck stiffness, dental problem, voice change, sinus pressure and tinnitus.   Eyes: Negative for photophobia, pain, redness and visual disturbance.  Respiratory: Negative for apnea, cough, choking, chest tightness, shortness of breath and wheezing.   Cardiovascular: Negative for chest pain, palpitations and leg swelling.  Gastrointestinal: Negative for nausea, vomiting,  abdominal pain, diarrhea, constipation, blood in stool, abdominal distention, anal bleeding and rectal pain.  Genitourinary: Negative for dysuria, urgency, frequency, hematuria, flank pain, decreased urine volume, discharge, penile swelling, scrotal swelling, difficulty urinating, genital sores and testicular pain.  Musculoskeletal: Negative for myalgias, back pain, joint swelling, arthralgias and gait problem.  Skin: Negative for color change, rash and wound.  Neurological: Negative for dizziness, tremors, seizures, syncope, facial asymmetry, speech difficulty, weakness, light-headedness, numbness and headaches.  Hematological: Negative for adenopathy. Does not bruise/bleed easily.  Psychiatric/Behavioral: Negative for suicidal ideas, hallucinations, behavioral problems, confusion, sleep disturbance, self-injury, dysphoric mood, decreased concentration and agitation. The patient is not nervous/anxious.        Objective:   Physical Exam  Constitutional: He appears well-developed and well-nourished.  HENT:  Head: Normocephalic and atraumatic.  Right Ear: External ear normal.  Left Ear: External ear normal.  Nose: Nose normal.  Mouth/Throat: Oropharynx is clear and moist.  Eyes: Conjunctivae and EOM are normal. Pupils are equal, round, and reactive to light. No scleral icterus.  Neck: Normal range of motion. Neck supple. No JVD present. No thyromegaly present.  Cardiovascular: Regular rhythm, normal heart sounds and intact distal pulses.  Exam reveals no gallop and no friction rub.   No murmur heard. Pulmonary/Chest: Effort normal and breath sounds normal. He exhibits no tenderness.  Abdominal: Soft. Bowel sounds are normal. He exhibits no distension and no mass. There is no tenderness.  Genitourinary: Prostate normal and penis normal.  Musculoskeletal: Normal range of motion. He exhibits no edema and no tenderness.  Lymphadenopathy:    He has no cervical adenopathy.  Neurological: He is  alert. He has normal reflexes. No cranial nerve deficit. Coordination normal.  Skin: Skin is warm and dry. No rash noted.  Psychiatric: He has a normal mood and affect. His behavior is normal.          Assessment & Plan:    Preventive health examination Nephrolithiasis Gastroesophageal reflux disease  A more regular vigorous exercise regimen encouraged. Medical regimen unchanged. Will recheck in one year or as needed

## 2011-07-03 ENCOUNTER — Other Ambulatory Visit: Payer: Self-pay

## 2011-07-03 MED ORDER — ACYCLOVIR 200 MG PO CAPS: ORAL_CAPSULE | ORAL | Status: DC

## 2011-07-03 NOTE — Telephone Encounter (Signed)
Rx sent to pharmacy electronically.   

## 2011-09-16 ENCOUNTER — Other Ambulatory Visit: Payer: Self-pay

## 2011-09-16 ENCOUNTER — Telehealth: Payer: Self-pay | Admitting: *Deleted

## 2011-09-16 MED ORDER — LORAZEPAM 0.5 MG PO TABS: 0.5000 mg | ORAL_TABLET | Freq: Every evening | ORAL | Status: DC | PRN

## 2011-09-16 NOTE — Telephone Encounter (Signed)
Noted - med was called in this AM

## 2011-09-16 NOTE — Telephone Encounter (Signed)
Called in.

## 2011-09-16 NOTE — Telephone Encounter (Signed)
Pt scheduled with Dr. Clent Ridges tomorrow for abdominal pain as Dr. Kirtland Bouchard is not here the rest of the week, and he could not get here in time today. No cardiac symptoms, but advised what to do if he develops any symptoms of heart attack.  He prefers to wait until tomorrow to see MD in the office.  He also needs his Ativan refilled, and states there should be a fax waiting here.

## 2011-09-17 ENCOUNTER — Encounter: Payer: Self-pay | Admitting: Family Medicine

## 2011-09-17 ENCOUNTER — Ambulatory Visit (INDEPENDENT_AMBULATORY_CARE_PROVIDER_SITE_OTHER): Payer: No Typology Code available for payment source | Admitting: Family Medicine

## 2011-09-17 VITALS — BP 132/84 | HR 84 | Temp 98.4°F | Wt 175.0 lb

## 2011-09-17 DIAGNOSIS — R1013 Epigastric pain: Secondary | ICD-10-CM

## 2011-09-17 NOTE — Progress Notes (Signed)
  Subjective:    Patient ID: Robert Cowan, male    DOB: March 24, 1951, 61 y.o.   MRN: [id number]  HPI Here for 3 days of epigastric pains. He has never felt these before. They started one evening shortly after dinner, and this was the worst of the pains. They have persisted since then, but they are steadily getting better. Today he has only slight epigastric discomfort. He has had no fever or nausea at all. He feels the need to belch at times. Urinations and BMs are normal. He has a hx of GERD, but this rarely bothers him. He uses Nexium about once every few months, but he has taken this daily for the past 3 days.    Review of Systems  Constitutional: Negative.   Respiratory: Negative.   Cardiovascular: Negative.   Gastrointestinal: Positive for abdominal pain. Negative for nausea, vomiting, diarrhea, constipation, blood in stool and abdominal distention.       Objective:   Physical Exam  Constitutional: He appears well-developed and well-nourished. No distress.  Cardiovascular: Normal rate, regular rhythm, normal heart sounds and intact distal pulses.   Pulmonary/Chest: Effort normal and breath sounds normal.  Abdominal: Soft. He exhibits no distension and no mass. There is no rebound and no guarding.       Slightly tender in the epigastrium           Assessment & Plan:  This is most likely some duodenitis, so I advised him to continue with a daily Nexium for several weeks. We will set up an [place] to rule out gall bladder problems

## 2011-09-25 ENCOUNTER — Ambulatory Visit
Admission: RE | Admit: 2011-09-25 | Discharge: 2011-09-25 | Disposition: A | Discharge status: Home / Self Care | Payer: No Typology Code available for payment source | Source: Ambulatory Visit | Attending: Family Medicine | Admitting: Family Medicine

## 2011-09-25 DIAGNOSIS — R1013 Epigastric pain: Secondary | ICD-10-CM

## 2011-09-25 NOTE — Progress Notes (Signed)
Quick Note:  Left message with normal results. ______ 

## 2011-12-11 ENCOUNTER — Other Ambulatory Visit: Payer: Self-pay | Admitting: Internal Medicine

## 2011-12-14 ENCOUNTER — Other Ambulatory Visit: Payer: Self-pay | Admitting: Internal Medicine

## 2011-12-14 NOTE — Telephone Encounter (Signed)
Patient called stating that he need a refill of his cialis sent to [facility] on [address]. Please assist.

## 2011-12-14 NOTE — Telephone Encounter (Signed)
Called and spoke with sara at [facility] - they have rx I sent on friday

## 2012-01-06 ENCOUNTER — Other Ambulatory Visit: Payer: Self-pay

## 2012-01-06 MED ORDER — LORAZEPAM 0.5 MG PO TABS: 0.5000 mg | ORAL_TABLET | Freq: Every evening | ORAL | Status: DC | PRN

## 2012-02-11 ENCOUNTER — Telehealth: Payer: Self-pay | Admitting: Internal Medicine

## 2012-02-11 MED ORDER — LORAZEPAM 0.5 MG PO TABS: 0.5000 mg | ORAL_TABLET | Freq: Every evening | ORAL | Status: DC | PRN

## 2012-02-11 NOTE — Telephone Encounter (Signed)
Pt called and had sch an cpx for 03/25/12 and fasting labs on 03/18/12. Pt is req to get a refill of LORazepam (ATIVAN) 0.5 MG tablet to last until appt date. Pls call in to [facility] on [address].

## 2012-02-11 NOTE — Telephone Encounter (Signed)
Called in 30 day rx - no furuther RFs until cpx

## 2012-03-02 ENCOUNTER — Telehealth: Payer: Self-pay | Admitting: Internal Medicine

## 2012-03-02 MED ORDER — LORAZEPAM 0.5 MG PO TABS: 0.5000 mg | ORAL_TABLET | Freq: Every evening | ORAL | Status: DC | PRN

## 2012-03-02 NOTE — Telephone Encounter (Signed)
Called in # 30 - this is a prn med

## 2012-03-02 NOTE — Telephone Encounter (Signed)
Pt called and said that his cpx sch on 10/18 had to be rsc, since pcp out of office. Pt was rsch to 04/15/12 and is going to need refill of LORazepam (ATIVAN) 0.5 MG tablet called in to [facility] on [address], to last until appt date.

## 2012-03-18 ENCOUNTER — Other Ambulatory Visit (INDEPENDENT_AMBULATORY_CARE_PROVIDER_SITE_OTHER): Payer: No Typology Code available for payment source

## 2012-03-18 DIAGNOSIS — Z0000 Encounter for general adult medical examination without abnormal findings: Secondary | ICD-10-CM

## 2012-03-18 DIAGNOSIS — Z13220 Encounter for screening for lipoid disorders: Secondary | ICD-10-CM

## 2012-03-18 LAB — CBC WITH DIFFERENTIAL/PLATELET
Basophils Absolute: 0 10*3/uL (ref 0.0–0.1)
Hemoglobin: 15.9 g/dL (ref 13.0–17.0)
Lymphocytes Relative: 39 % (ref 12.0–46.0)
Monocytes Relative: 8 % (ref 3.0–12.0)
Neutro Abs: 2.9 10*3/uL (ref 1.4–7.7)
Neutrophils Relative %: 51 % (ref 43.0–77.0)
RBC: 5.13 Mil/uL (ref 4.22–5.81)
RDW: 12.9 % (ref 11.5–14.6)

## 2012-03-18 LAB — HEPATIC FUNCTION PANEL
AST: 23 U/L (ref 0–37)
Albumin: 3.9 g/dL (ref 3.5–5.2)
Alkaline Phosphatase: 41 U/L (ref 39–117)
Bilirubin, Direct: 0.2 mg/dL (ref 0.0–0.3)
Total Bilirubin: 1 mg/dL (ref 0.3–1.2)

## 2012-03-18 LAB — BASIC METABOLIC PANEL
Calcium: 9.7 mg/dL (ref 8.4–10.5)
GFR: 68.79 mL/min (ref >60.00)
Glucose, Bld: 98 mg/dL (ref 70–99)
Sodium: 143 mEq/L (ref 135–145)

## 2012-03-18 LAB — POCT URINALYSIS DIPSTICK
Bilirubin, UA: NEGATIVE
Glucose, UA: NEGATIVE
Ketones, UA: NEGATIVE
Leukocytes, UA: NEGATIVE
Spec Grav, UA: >=1.03

## 2012-03-18 LAB — LIPID PANEL
Cholesterol: 204 mg/dL — ABNORMAL HIGH (ref 0–200)
HDL: 63.5 mg/dL (ref >39.00)
Total CHOL/HDL Ratio: 3
Triglycerides: 57 mg/dL (ref 0.0–149.0)
VLDL: 11.4 mg/dL (ref 0.0–40.0)

## 2012-03-25 ENCOUNTER — Encounter: Payer: No Typology Code available for payment source | Admitting: Internal Medicine

## 2012-04-15 ENCOUNTER — Encounter: Payer: Self-pay | Admitting: Internal Medicine

## 2012-04-15 ENCOUNTER — Ambulatory Visit (INDEPENDENT_AMBULATORY_CARE_PROVIDER_SITE_OTHER): Payer: No Typology Code available for payment source | Admitting: Internal Medicine

## 2012-04-15 VITALS — BP 120/80 | HR 68 | Temp 98.1°F | Resp 18 | Ht 69.0 in | Wt 177.0 lb

## 2012-04-15 DIAGNOSIS — Z0000 Encounter for general adult medical examination without abnormal findings: Secondary | ICD-10-CM

## 2012-04-15 DIAGNOSIS — R9431 Abnormal electrocardiogram [ECG] [EKG]: Secondary | ICD-10-CM | POA: Insufficient documentation

## 2012-04-15 DIAGNOSIS — K219 Gastro-esophageal reflux disease without esophagitis: Secondary | ICD-10-CM

## 2012-04-15 MED ORDER — TADALAFIL 20 MG PO TABS: 20.0000 mg | ORAL_TABLET | Freq: Every day | ORAL | Status: DC | PRN

## 2012-04-15 MED ORDER — SERTRALINE HCL 50 MG PO TABS: 50.0000 mg | ORAL_TABLET | Freq: Every day | ORAL | Status: DC

## 2012-04-15 MED ORDER — LORAZEPAM 0.5 MG PO TABS: 0.5000 mg | ORAL_TABLET | Freq: Every evening | ORAL | Status: DC | PRN

## 2012-04-15 MED ORDER — ESOMEPRAZOLE MAGNESIUM 40 MG PO CPDR: 40.0000 mg | DELAYED_RELEASE_CAPSULE | Freq: Every day | ORAL | Status: DC

## 2012-04-15 NOTE — Patient Instructions (Signed)
It is important that you exercise regularly, at least 20 minutes 3 to 4 times per week.  If you develop chest pain or shortness of breath seek  medical attention.   

## 2012-04-15 NOTE — Progress Notes (Signed)
Subjective:    Patient ID: Robert Cowan, male    DOB: [birth year], 61 y.o.   MRN: [id number]  HPI 61 -year-old patient who is seen today for a preventive health examination. He has a history of mild diverticular disease and family history of colon cancer. He has had a followup colonoscopy in June of last  year. He has a history of insomnia gastroesophageal reflux disease and has done quite well. He also has a history of nephrolithiasis which has been asymptomatic. No other concerns or complaints. Laboratory studies were reviewed  Allergies:  1) ! Tetracycline Hcl (Tetracycline Hcl)   Past History:  Past Medical History:  GERD  Diverticulosis, colon  ED  Melanoma  Nephrolithiasis, hx of  Insomnia  family history of colon cancer   Past Surgical History:   Reviewed history from 11/18/2007 and no changes required.  Mohs surgery for melanoma in situ, left Malar area, March 2006  resection dysplastic nevus, right anterior chest wall, June 2009  Colonoscopy August 2006  2012   Family History:  Reviewed history from 11/18/2007 and no changes required.  mother died at 80 of complications of colon cancer  father died of suicide death of several years later.  two brothers are well   Social History:  Reviewed history from 12/28/2008 and no changes required.  Single  Regular exercise-yes     Review of Systems  Constitutional: Negative for fever, chills, activity change, appetite change and fatigue.  HENT: Negative for hearing loss, ear pain, congestion, rhinorrhea, sneezing, mouth sores, trouble swallowing, neck pain, neck stiffness, dental problem, voice change, sinus pressure and tinnitus.   Eyes: Negative for photophobia, pain, redness and visual disturbance.  Respiratory: Negative for apnea, cough, choking, chest tightness, shortness of breath and wheezing.   Cardiovascular: Negative for chest pain, palpitations and leg swelling.  Gastrointestinal: Negative for nausea,  vomiting, abdominal pain, diarrhea, constipation, blood in stool, abdominal distention, anal bleeding and rectal pain.  Genitourinary: Negative for dysuria, urgency, frequency, hematuria, flank pain, decreased urine volume, discharge, penile swelling, scrotal swelling, difficulty urinating, genital sores and testicular pain.  Musculoskeletal: Negative for myalgias, back pain, joint swelling, arthralgias and gait problem.  Skin: Negative for color change, rash and wound.  Neurological: Negative for dizziness, tremors, seizures, syncope, facial asymmetry, speech difficulty, weakness, light-headedness, numbness and headaches.  Hematological: Negative for adenopathy. Does not bruise/bleed easily.  Psychiatric/Behavioral: Negative for suicidal ideas, hallucinations, behavioral problems, confusion, sleep disturbance, self-injury, dysphoric mood, decreased concentration and agitation. The patient is not nervous/anxious.        Objective:   Physical Exam  Constitutional: He appears well-developed and well-nourished.  HENT:  Head: Normocephalic and atraumatic.  Right Ear: External ear normal.  Left Ear: External ear normal.  Nose: Nose normal.  Mouth/Throat: Oropharynx is clear and moist.  Eyes: Conjunctivae normal and EOM are normal. Pupils are equal, round, and reactive to light. No scleral icterus.  Neck: Normal range of motion. Neck supple. No JVD present. No thyromegaly present.  Cardiovascular: Regular rhythm, normal heart sounds and intact distal pulses.  Exam reveals no gallop and no friction rub.   No murmur heard. Pulmonary/Chest: Effort normal and breath sounds normal. He exhibits no tenderness.  Abdominal: Soft. Bowel sounds are normal. He exhibits no distension and no mass. There is no tenderness.  Genitourinary: Prostate normal and penis normal.  Musculoskeletal: Normal range of motion. He exhibits no edema and no tenderness.  Lymphadenopathy:    He has no cervical adenopathy.  Neurological: He is alert. He has normal reflexes. No cranial nerve deficit. Coordination normal.  Skin: Skin is warm and dry. No rash noted.  Psychiatric: He has a normal mood and affect. His behavior is normal.          Assessment & Plan:    Preventive health examination Nephrolithiasis Gastroesophageal reflux disease Abnormal EKG. 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 Prior MI cannot be excluded. We'll schedule a stress echo. If this is normal as expected the patient will be encouraged to pursue a more vigorous exercise regimen

## 2012-04-26 ENCOUNTER — Other Ambulatory Visit (HOSPITAL_COMMUNITY): Payer: No Typology Code available for payment source

## 2012-05-17 ENCOUNTER — Ambulatory Visit (HOSPITAL_COMMUNITY): Payer: No Typology Code available for payment source | Attending: Cardiovascular Disease

## 2012-05-17 ENCOUNTER — Ambulatory Visit (HOSPITAL_COMMUNITY): Payer: No Typology Code available for payment source | Attending: Internal Medicine

## 2012-05-17 ENCOUNTER — Encounter: Payer: Self-pay | Admitting: Internal Medicine

## 2012-05-17 DIAGNOSIS — R9431 Abnormal electrocardiogram [ECG] [EKG]: Secondary | ICD-10-CM

## 2012-05-17 DIAGNOSIS — R0989 Other specified symptoms and signs involving the circulatory and respiratory systems: Secondary | ICD-10-CM

## 2012-05-17 DIAGNOSIS — R0789 Other chest pain: Secondary | ICD-10-CM | POA: Insufficient documentation

## 2012-05-17 NOTE — Progress Notes (Signed)
Echocardiogram performed.  

## 2012-05-30 ENCOUNTER — Other Ambulatory Visit: Payer: Self-pay | Admitting: Internal Medicine

## 2012-10-13 ENCOUNTER — Other Ambulatory Visit: Payer: Self-pay | Admitting: Internal Medicine

## 2013-02-18 ENCOUNTER — Other Ambulatory Visit: Payer: Self-pay | Admitting: Internal Medicine

## 2013-04-06 ENCOUNTER — Other Ambulatory Visit: Payer: Self-pay | Admitting: Internal Medicine

## 2013-04-13 ENCOUNTER — Other Ambulatory Visit: Payer: Self-pay

## 2013-05-30 ENCOUNTER — Other Ambulatory Visit: Payer: Self-pay | Admitting: Internal Medicine

## 2013-08-06 ENCOUNTER — Other Ambulatory Visit: Payer: Self-pay | Admitting: Internal Medicine

## 2013-08-10 ENCOUNTER — Other Ambulatory Visit: Payer: Self-pay | Admitting: Internal Medicine

## 2013-09-11 ENCOUNTER — Other Ambulatory Visit: Payer: Self-pay | Admitting: Internal Medicine

## 2013-09-15 ENCOUNTER — Other Ambulatory Visit (INDEPENDENT_AMBULATORY_CARE_PROVIDER_SITE_OTHER): Payer: No Typology Code available for payment source

## 2013-09-15 DIAGNOSIS — Z0000 Encounter for general adult medical examination without abnormal findings: Secondary | ICD-10-CM

## 2013-09-15 LAB — CBC WITH DIFFERENTIAL/PLATELET
BASOS PCT: 0.8 % (ref 0.0–3.0)
Basophils Absolute: 0 10*3/uL (ref 0.0–0.1)
EOS ABS: 0.1 10*3/uL (ref 0.0–0.7)
Eosinophils Relative: 2 % (ref 0.0–5.0)
HEMATOCRIT: 46.8 % (ref 39.0–52.0)
HEMOGLOBIN: 15.5 g/dL (ref 13.0–17.0)
LYMPHS PCT: 41.3 % (ref 12.0–46.0)
Lymphs Abs: 2.4 10*3/uL (ref 0.7–4.0)
MCHC: 33.2 g/dL (ref 30.0–36.0)
MCV: 91.6 fl (ref 78.0–100.0)
Monocytes Absolute: 0.5 10*3/uL (ref 0.1–1.0)
Monocytes Relative: 8.6 % (ref 3.0–12.0)
NEUTROS ABS: 2.7 10*3/uL (ref 1.4–7.7)
Neutrophils Relative %: 47.3 % (ref 43.0–77.0)
Platelets: 227 10*3/uL (ref 150.0–400.0)
RBC: 5.1 Mil/uL (ref 4.22–5.81)
RDW: 13.5 % (ref 11.5–14.6)
WBC: 5.8 10*3/uL (ref 4.5–10.5)

## 2013-09-15 LAB — POCT URINALYSIS DIPSTICK
Bilirubin, UA: NEGATIVE
Blood, UA: NEGATIVE
Glucose, UA: NEGATIVE
KETONES UA: NEGATIVE
Leukocytes, UA: NEGATIVE
Nitrite, UA: NEGATIVE
PH UA: 5.5
PROTEIN UA: NEGATIVE
SPEC GRAV UA: 1.025
Urobilinogen, UA: 0.2

## 2013-09-15 LAB — BASIC METABOLIC PANEL
BUN: 26 mg/dL — ABNORMAL HIGH (ref 6–23)
CALCIUM: 9.4 mg/dL (ref 8.4–10.5)
CHLORIDE: 108 meq/L (ref 96–112)
CO2: 26 meq/L (ref 19–32)
Creatinine, Ser: 1 mg/dL (ref 0.4–1.5)
GFR: 83.31 mL/min (ref >60.00)
Glucose, Bld: 90 mg/dL (ref 70–99)
POTASSIUM: 3.7 meq/L (ref 3.5–5.1)
SODIUM: 141 meq/L (ref 135–145)

## 2013-09-15 LAB — HEPATIC FUNCTION PANEL
ALBUMIN: 4.1 g/dL (ref 3.5–5.2)
ALK PHOS: 49 U/L (ref 39–117)
ALT: 17 U/L (ref 0–53)
AST: 24 U/L (ref 0–37)
Bilirubin, Direct: 0.2 mg/dL (ref 0.0–0.3)
TOTAL PROTEIN: 6.7 g/dL (ref 6.0–8.3)
Total Bilirubin: 1.2 mg/dL (ref 0.3–1.2)

## 2013-09-15 LAB — LIPID PANEL
Cholesterol: 205 mg/dL — ABNORMAL HIGH (ref 0–200)
HDL: 68 mg/dL (ref >39.00)
LDL CALC: 126 mg/dL — AB (ref 0–99)
Total CHOL/HDL Ratio: 3
Triglycerides: 54 mg/dL (ref 0.0–149.0)
VLDL: 10.8 mg/dL (ref 0.0–40.0)

## 2013-09-15 LAB — TSH: TSH: 2.59 u[IU]/mL (ref 0.35–5.50)

## 2013-09-15 LAB — PSA: PSA: 1.52 ng/mL (ref 0.10–4.00)

## 2013-09-22 ENCOUNTER — Encounter: Payer: Self-pay | Admitting: Internal Medicine

## 2013-09-22 ENCOUNTER — Ambulatory Visit (INDEPENDENT_AMBULATORY_CARE_PROVIDER_SITE_OTHER): Payer: No Typology Code available for payment source | Admitting: Internal Medicine

## 2013-09-22 VITALS — BP 120/80 | HR 79 | Temp 98.7°F | Resp 20 | Ht 68.0 in | Wt 178.0 lb

## 2013-09-22 DIAGNOSIS — R9431 Abnormal electrocardiogram [ECG] [EKG]: Secondary | ICD-10-CM

## 2013-09-22 DIAGNOSIS — Z0000 Encounter for general adult medical examination without abnormal findings: Secondary | ICD-10-CM

## 2013-09-22 MED ORDER — OMEPRAZOLE 20 MG PO CPDR: 20.0000 mg | DELAYED_RELEASE_CAPSULE | Freq: Every day | ORAL | Status: DC

## 2013-09-22 MED ORDER — LORAZEPAM 0.5 MG PO TABS: ORAL_TABLET | ORAL | Status: DC

## 2013-09-22 NOTE — Progress Notes (Signed)
Subjective:    Patient ID: Robert Cowan, male    DOB: 05-26-1951, 63 y.o.   MRN: [id number]  HPI  63 year old patient who is seen today for a preventive health examination.  He continues to do quite well.  He did have a stress echocardiogram in December of 2013.  This was normal.  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  He has a family history of colon cancer.  His mother died at age 22 of complications of colon cancer.  His last colonoscopy was in June of 2012.  He has a history of nephrolithiasis, which has been stable.  He has mild gastro-esophageal reflux disease.  No new concerns or complaints  Past Medical History  Diagnosis Date  . DIVERTICULOSIS, COLON 02/02/2007  . GERD 01/28/2007  . KIDNEY STONE 04/19/2008  . 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 RENAL CALCULUS, LEFT 04/19/2008  . 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 Insomnia   . Melanoma     History   Social History  . Marital Status: Single    Spouse Name: N/A    Number of Children: N/A  . Years of Education: N/A   Occupational History  . Not on file.   Social History Main Topics  . Smoking status: Never Smoker   . Smokeless tobacco: Never Used  . Alcohol Use: 2.0 oz/week    4 drink(s) per week  . Drug Use: No  . Sexual Activity: Not on file   Other Topics Concern  . Not on file   Social History Narrative   Single   Gets regular exercise    Past Surgical History  Procedure Laterality Date  . Melanoma excision      left cheek/face  . Nose surgery      Deviated Septum  . Colonoscopy      Family History  Problem Relation Age of Onset  . Colon cancer Mother   . Cancer Mother     colon    Allergies  Allergen Reactions  . Tetracycline Hcl     REACTION: blister on genitals    Current Outpatient Prescriptions on File Prior to Visit  Medication Sig Dispense Refill  . acyclovir (ZOVIRAX) 200 MG capsule AS DIRECTED  90 capsule  0  . aspirin 81 MG  tablet Take 81 mg by mouth daily.        Marland Kitchen CIALIS 20 MG tablet TAKE 1 TABLET BY MOUTH EVERY DAY AS NEEDED  10 tablet  2  . diphenhydramine-acetaminophen (TYLENOL PM) 25-500 MG TABS Take 1 tablet by mouth at bedtime as needed. sleep       . Flaxseed, Linseed, (FLAXSEED OIL PO) Take 1,000 mg by mouth daily.        . Multiple Vitamin (MULTI-DAY VITAMINS PO) Take by mouth daily.         No current facility-administered medications on file prior to visit.    BP 120/80  Pulse 79  Temp(Src) 98.7 F (37.1 C) (Oral)  Resp 20  Ht 5\' 8"  (1.727 m)  Wt 178 lb (80.74 kg)  BMI 27.07 kg/m2  SpO2 97%     Review of Systems  Constitutional: Negative for fever, chills, activity change, appetite change and fatigue.  HENT: Negative for congestion, dental problem, ear pain, hearing loss, mouth sores, rhinorrhea, sinus pressure, sneezing, tinnitus, trouble swallowing and voice change.   Eyes: Negative for photophobia, pain, redness and visual disturbance.  Respiratory: Negative for apnea, cough, choking, chest  tightness, shortness of breath and wheezing.   Cardiovascular: Negative for chest pain, palpitations and leg swelling.  Gastrointestinal: Negative for nausea, vomiting, abdominal pain, diarrhea, constipation, blood in stool, abdominal distention, anal bleeding and rectal pain.  Genitourinary: Negative for dysuria, urgency, frequency, hematuria, flank pain, decreased urine volume, discharge, penile swelling, scrotal swelling, difficulty urinating, genital sores and testicular pain.  Musculoskeletal: Negative for arthralgias, back pain, gait problem, joint swelling, myalgias, neck pain and neck stiffness.  Skin: Negative for color change, rash and wound.  Neurological: Negative for dizziness, tremors, seizures, syncope, facial asymmetry, speech difficulty, weakness, light-headedness, numbness and headaches.  Hematological: Negative for adenopathy. Does not bruise/bleed easily.  Psychiatric/Behavioral:  Negative for suicidal ideas, hallucinations, behavioral problems, confusion, sleep disturbance, self-injury, dysphoric mood, decreased concentration and agitation. The patient is not nervous/anxious.        Objective:   Physical Exam  Constitutional: He appears well-developed and well-nourished.  HENT:  Head: Normocephalic and atraumatic.  Right Ear: External ear normal.  Left Ear: External ear normal.  Nose: Nose normal.  Mouth/Throat: Oropharynx is clear and moist.  Eyes: Conjunctivae and EOM are normal. Pupils are equal, round, and reactive to light. No scleral icterus.  Neck: Normal range of motion. Neck supple. No JVD present. No thyromegaly present.  Cardiovascular: Regular rhythm, normal heart sounds and intact distal pulses.  Exam reveals no gallop and no friction rub.   No murmur heard. Pulmonary/Chest: Effort normal and breath sounds normal. He exhibits no tenderness.  Abdominal: Soft. Bowel sounds are normal. He exhibits no distension and no mass. There is no tenderness.  Genitourinary: Penis normal.  Musculoskeletal: Normal range of motion. He exhibits no edema and no tenderness.  Lymphadenopathy:    He has no cervical adenopathy.  Neurological: He is alert. He has normal reflexes. No cranial nerve deficit. Coordination normal.  Skin: Skin is warm and dry. No rash noted.  Psychiatric: He has a normal mood and affect. His behavior is normal.          Assessment & Plan:   Preventive health examination Family history of colon cancer.  Continue colonoscopies every 5 years Nephrolithiasis.  Asymptomatic Abnormal EKG with left anterior hemiblock.  Normal stress echo  Recheck one year

## 2013-09-22 NOTE — Patient Instructions (Addendum)
Limit your sodium (Salt) intake    It is important that you exercise regularly, at least 20 minutes 3 to 4 times per week.  If you develop chest pain or shortness of breath seek  medical attention.  Return in one year for follow-up  Avoids foods high in acid such as tomatoes citrus juices, and spicy foods.  Avoid eating within two hours of lying down or before exercising.  Do not overheat.  Try smaller more frequent meals.  If symptoms persist, elevate the head of her bed 12 inches while sleeping.

## 2013-09-22 NOTE — Progress Notes (Signed)
Pre-visit discussion using our clinic review tool. No additional management support is needed unless otherwise documented below in the visit note.  

## 2013-11-25 ENCOUNTER — Encounter: Payer: Self-pay | Admitting: Emergency Medicine

## 2013-11-25 ENCOUNTER — Ambulatory Visit (INDEPENDENT_AMBULATORY_CARE_PROVIDER_SITE_OTHER): Payer: PRIVATE HEALTH INSURANCE | Admitting: Emergency Medicine

## 2013-11-25 DIAGNOSIS — S0101XA Laceration without foreign body of scalp, initial encounter: Secondary | ICD-10-CM

## 2013-11-25 DIAGNOSIS — R51 Headache: Secondary | ICD-10-CM

## 2013-11-25 DIAGNOSIS — S0100XA Unspecified open wound of scalp, initial encounter: Secondary | ICD-10-CM

## 2013-11-25 NOTE — Progress Notes (Signed)
[facility] [address]  [place] [address] [phone]  Date:  11/25/2013   Name:  Robert Cowan   DOB:  [birth year]   MRN:  [id number]  PCP:  Nyoka Cowden, MD    Chief Complaint: No chief complaint on file.   History of Present Illness:  Robert Cowan is a 63 y.o. very pleasant male patient who presents with the following:  Injured today while straightening a fence post and slipped, striking his head on the fence post.  No LOC or neuro symptoms.  Now has come to office with a laceration of the crown.  Current on TD.  No improvement with over the counter medications or other home remedies. Denies other complaint or health concern today.   Patient Active Problem List   Diagnosis Date Noted  . EKG abnormalities 04/15/2012  . 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 RENAL CALCULUS, LEFT 04/19/2008  . 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 KIDNEY STONE 04/19/2008  . DIVERTICULOSIS, COLON 02/02/2007  . GERD 01/28/2007    Past Medical History  Diagnosis Date  . DIVERTICULOSIS, COLON 02/02/2007  . GERD 01/28/2007  . KIDNEY STONE 04/19/2008  . 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 RENAL CALCULUS, LEFT 04/19/2008  . 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 Insomnia   . Melanoma     Past Surgical History  Procedure Laterality Date  . Melanoma excision      left cheek/face  . Nose surgery      Deviated Septum  . Colonoscopy      History  Substance Use Topics  . Smoking status: Never Smoker   . Smokeless tobacco: Never Used  . Alcohol Use: 2.0 oz/week    4 drink(s) per week    Family History  Problem Relation Age of Onset  . Colon cancer Mother   . Cancer Mother     colon    Allergies  Allergen Reactions  . Tetracycline Hcl     REACTION: blister on genitals    Medication list has been reviewed and updated.  Current Outpatient Prescriptions on File Prior to Visit  Medication Sig Dispense Refill  . acyclovir (ZOVIRAX) 200 MG capsule AS  DIRECTED  90 capsule  0  . aspirin 81 MG tablet Take 81 mg by mouth daily.        Marland Kitchen CIALIS 20 MG tablet TAKE 1 TABLET BY MOUTH EVERY DAY AS NEEDED  10 tablet  2  . diphenhydramine-acetaminophen (TYLENOL PM) 25-500 MG TABS Take 1 tablet by mouth at bedtime as needed. sleep       . Flaxseed, Linseed, (FLAXSEED OIL PO) Take 1,000 mg by mouth daily.        Marland Kitchen LORazepam (ATIVAN) 0.5 MG tablet TAKE 1 TABLET AT BEDTIME AS NEEDED  60 tablet  2  . Multiple Vitamin (MULTI-DAY VITAMINS PO) Take by mouth daily.        Marland Kitchen omeprazole (PRILOSEC) 20 MG capsule Take 1 capsule (20 mg total) by mouth daily.  90 capsule  3   No current facility-administered medications on file prior to visit.    Review of Systems:  As per HPI, otherwise negative.    Physical Examination: There were no vitals filed for this visit. There were no vitals filed for this visit. There is no weight on file to calculate BMI. Ideal Body Weight:     GEN: WDWN, NAD, Non-toxic, Alert & Oriented x 3 HEENT: laceration of scalp at crown no FB.  , Normocephalic.  Ears and  Nose: No external deformity. EXTR: No clubbing/cyanosis/edema NEURO: Normal gait.  PSYCH: Normally interactive. Conversant. Not depressed or anxious appearing.  Calm demeanor.    Assessment and Plan: Laceration scalp Headache  Signed,  Ellison Carwin, MD

## 2013-11-25 NOTE — Progress Notes (Signed)
Consent obtained - Local anesthesia with 2% lido with epi.  Cleaned wound and explored.  Staples placed into wound with good closure.  Wound care d/w pt.

## 2013-12-02 ENCOUNTER — Ambulatory Visit (INDEPENDENT_AMBULATORY_CARE_PROVIDER_SITE_OTHER): Payer: PRIVATE HEALTH INSURANCE | Admitting: Physician Assistant

## 2013-12-02 VITALS — BP 122/72 | HR 76 | Temp 98.3°F | Resp 18 | Ht 68.5 in | Wt 174.5 lb

## 2013-12-02 DIAGNOSIS — Z4802 Encounter for removal of sutures: Secondary | ICD-10-CM

## 2013-12-02 NOTE — Progress Notes (Signed)
   Subjective:    Patient ID: Robert Cowan, male    DOB: [birth year], 63 y.o.   MRN: [id number]  Suture / Staple Removal     Robert Cowan is a very pleasant 63 yr old male here for removal of staples.  Staples were placed here 11/25/13.  Pt reports he is doing very well.  He denies pain or drainage from the area.    Review of Systems  Constitutional: Negative for fever and chills.  Skin: Positive for wound.  Neurological: Negative for headaches.       Objective:   Physical Exam  Vitals reviewed. Constitutional: He is oriented to person, place, and time. He appears well-developed and well-nourished. No distress.  HENT:  Head: Normocephalic.    Healing wound of scalp; staples removed without difficulty  Pulmonary/Chest: Effort normal.  Neurological: He is alert and oriented to person, place, and time.  Skin: Skin is warm and dry.  Psychiatric: He has a normal mood and affect. His behavior is normal.       Assessment & Plan:  Encounter for staple removal   Robert Cowan is a very pleasant 63 yr old male here for removal of staples that were placed here 11/25/13.  The wound is healing very well.  Staples removed without difficulty.  Follow up if needed.    Alonza Smoker MHS, PA-C [facility] 6/27/201511:31 AM

## 2013-12-20 ENCOUNTER — Telehealth: Payer: Self-pay | Admitting: Internal Medicine

## 2013-12-20 NOTE — Telephone Encounter (Signed)
Per Dr. Raliegh Ip, pt can continue abx until he is seen on Friday and Dr. Raliegh Ip will decide if he needs to continue or change. Pt is aware and verbalized understanding

## 2013-12-20 NOTE — Telephone Encounter (Signed)
Caller: Sam/Patient; Phone: [phone]; Reason for Call: Patient evaluated at [facility] in [facility] yesterday, 7/14 with CXR due to 3 weeks of intermittent fever, bodyaches, headaches, sore throat, head/throat congestion.  Per patient report he was told by Dr.  Mitchel Honour at 7/14 evaluation that he had bronchitis and was given Rx for Levofloxacin 500 mg PO daily.  Patient became concerned when he read side effect for this medication could be tendon damage and/or rupture.  Patient is requesting Dr.  Marthann Schiller advice on whether to take this antibiotic or not.  Patient has an appointment with Dr.  Raliegh Ip this Friday, 7/17 @ 11am.  Patient did not want to continue taking the antibiotic until that time in the event that Dr.  Raliegh Ip would advise changing to different antibiotic.  PLEASE ADVISE AND FOLLOW UP WITH PATIENT REGARDING HIS QUESTION OF WHETHER TO CONTINUE MEDICATION PRESCRIBED OR TO START A NEW ANTIBIOTIC PRESCRIBED BY DR.  Ronney Lion YOU.

## 2013-12-22 ENCOUNTER — Ambulatory Visit (INDEPENDENT_AMBULATORY_CARE_PROVIDER_SITE_OTHER): Payer: No Typology Code available for payment source | Admitting: Internal Medicine

## 2013-12-22 ENCOUNTER — Encounter: Payer: Self-pay | Admitting: Internal Medicine

## 2013-12-22 VITALS — BP 110/74 | HR 86 | Temp 98.2°F | Resp 20 | Ht 68.5 in | Wt 179.0 lb

## 2013-12-22 DIAGNOSIS — N201 Calculus of ureter: Secondary | ICD-10-CM

## 2013-12-22 DIAGNOSIS — J069 Acute upper respiratory infection, unspecified: Secondary | ICD-10-CM

## 2013-12-22 NOTE — Progress Notes (Signed)
Pre visit review using our clinic review tool, if applicable. No additional management support is needed unless otherwise documented below in the visit note. 

## 2013-12-22 NOTE — Patient Instructions (Signed)
Discontinue Levaquin  Call or return to clinic prn if these symptoms worsen or fail to improve as anticipated.

## 2013-12-22 NOTE — Progress Notes (Signed)
Subjective:    Patient ID: Robert Cowan, male    DOB: [birth year], 63 y.o.   MRN: [id number]  HPI  63 year old patient who is seen today in followup.  He has had a 3 week history of acute febrile illness with sore throat, headache, mild nonproductive cough, sinus congestion, and postnasal drip.  He has had fatigue and intermittent fever.  After over 2 weeks.  He went to an urgent care 4 days ago and was placed on Levaquin.  Chest x-ray was obtained and was normal.  No laboratory studies.  Over the past day or 2.  He has felt much improved.  He was warned about antibiotic associated tendinopathy and he was concerned about continuing the antibiotic. His girlfriend developed identical symptoms 24 hours after his onset and has also been ill for a similar period of time.  She went to her PCP and was given a diagnosis of sinusitis  Past Medical History  Diagnosis Date  . DIVERTICULOSIS, COLON 02/02/2007  . GERD 01/28/2007  . KIDNEY STONE 04/19/2008  . 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 RENAL CALCULUS, LEFT 04/19/2008  . 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 Insomnia   . Melanoma     History   Social History  . Marital Status: Single    Spouse Name: N/A    Number of Children: N/A  . Years of Education: N/A   Occupational History  . Not on file.   Social History Main Topics  . Smoking status: Never Smoker   . Smokeless tobacco: Never Used  . Alcohol Use: 2.0 oz/week    4 drink(s) per week  . Drug Use: No  . Sexual Activity: Not on file   Other Topics Concern  . Not on file   Social History Narrative   Single   Gets regular exercise    Past Surgical History  Procedure Laterality Date  . Melanoma excision      left cheek/face  . Nose surgery      Deviated Septum  . Colonoscopy      Family History  Problem Relation Age of Onset  . Colon cancer Mother   . Cancer Mother     colon    Allergies  Allergen Reactions  . Tetracycline Hcl     REACTION: blister on genitals     Current Outpatient Prescriptions on File Prior to Visit  Medication Sig Dispense Refill  . acyclovir (ZOVIRAX) 200 MG capsule AS DIRECTED  90 capsule  0  . aspirin 81 MG tablet Take 81 mg by mouth daily.        Marland Kitchen CIALIS 20 MG tablet TAKE 1 TABLET BY MOUTH EVERY DAY AS NEEDED  10 tablet  2  . diphenhydramine-acetaminophen (TYLENOL PM) 25-500 MG TABS Take 1 tablet by mouth at bedtime as needed. sleep       . Flaxseed, Linseed, (FLAXSEED OIL PO) Take 1,000 mg by mouth daily.        Marland Kitchen LORazepam (ATIVAN) 0.5 MG tablet TAKE 1 TABLET AT BEDTIME AS NEEDED  60 tablet  2  . Multiple Vitamin (MULTI-DAY VITAMINS PO) Take by mouth daily.        Marland Kitchen omeprazole (PRILOSEC) 20 MG capsule Take 1 capsule (20 mg total) by mouth daily.  90 capsule  3   No current facility-administered medications on file prior to visit.    BP 110/74  Pulse 86  Temp(Src) 98.2 F (36.8 C) (Oral)  Resp 20  Ht 5' 8.5" (1.74 m)  Wt 179  lb (81.194 kg)  BMI 26.82 kg/m2  SpO2 98%     Review of Systems  Constitutional: Positive for fever, activity change, appetite change and fatigue. Negative for chills.  HENT: Positive for congestion and postnasal drip. Negative for dental problem, ear pain, hearing loss, sore throat, tinnitus, trouble swallowing and voice change.   Eyes: Negative for pain, discharge and visual disturbance.  Respiratory: Positive for cough. Negative for chest tightness, wheezing and stridor.   Cardiovascular: Negative for chest pain, palpitations and leg swelling.  Gastrointestinal: Negative for nausea, vomiting, abdominal pain, diarrhea, constipation, blood in stool and abdominal distention.  Genitourinary: Negative for urgency, hematuria, flank pain, discharge, difficulty urinating and genital sores.  Musculoskeletal: Positive for myalgias. Negative for arthralgias, back pain, gait problem, joint swelling and neck stiffness.  Skin: Negative for rash.  Neurological: Positive for weakness and headaches.  Negative for dizziness, syncope, speech difficulty and numbness.  Hematological: Negative for adenopathy. Does not bruise/bleed easily.  Psychiatric/Behavioral: Negative for behavioral problems and dysphoric mood. The patient is not nervous/anxious.        Objective:   Physical Exam  Constitutional: He is oriented to person, place, and time. He appears well-developed.  Afebrile Appears clinically well  HENT:  Head: Normocephalic.  Right Ear: External ear normal.  Left Ear: External ear normal.  Eyes: Conjunctivae and EOM are normal.  Neck: Normal range of motion.  Cardiovascular: Normal rate and normal heart sounds.   Pulmonary/Chest: Breath sounds normal.  Abdominal: Bowel sounds are normal.  Musculoskeletal: Normal range of motion. He exhibits no edema and no tenderness.  Neurological: He is alert and oriented to person, place, and time.  Psychiatric: He has a normal mood and affect. His behavior is normal.          Assessment & Plan:   Viral syndrome, resolved.  We'll discontinue all antibiotics at this time, and clinically observed.  Patient clinically feels well at the present time.  Will call if he has any relapse of his symptoms

## 2013-12-26 ENCOUNTER — Telehealth: Payer: Self-pay | Admitting: Internal Medicine

## 2013-12-26 NOTE — Telephone Encounter (Signed)
error 

## 2014-04-07 ENCOUNTER — Other Ambulatory Visit: Payer: Self-pay | Admitting: Internal Medicine

## 2014-04-11 ENCOUNTER — Telehealth: Payer: Self-pay | Admitting: Internal Medicine

## 2014-04-11 MED ORDER — LORAZEPAM 0.5 MG PO TABS: ORAL_TABLET | ORAL | Status: DC

## 2014-04-11 NOTE — Telephone Encounter (Signed)
Pt request refill of the following: LORazepam (ATIVAN) 0.5 MG tablet   Phamacy:  [facility] [address]

## 2014-04-11 NOTE — Telephone Encounter (Signed)
Rx called in to pharmacy. 

## 2014-05-21 ENCOUNTER — Other Ambulatory Visit: Payer: Self-pay | Admitting: Internal Medicine

## 2014-10-08 ENCOUNTER — Other Ambulatory Visit: Payer: Self-pay | Admitting: Internal Medicine

## 2014-10-10 ENCOUNTER — Encounter: Payer: Self-pay | Admitting: Internal Medicine

## 2014-10-23 ENCOUNTER — Other Ambulatory Visit: Payer: Self-pay | Admitting: Internal Medicine

## 2014-11-09 ENCOUNTER — Other Ambulatory Visit (INDEPENDENT_AMBULATORY_CARE_PROVIDER_SITE_OTHER): Payer: No Typology Code available for payment source

## 2014-11-09 DIAGNOSIS — Z79899 Other long term (current) drug therapy: Secondary | ICD-10-CM | POA: Diagnosis not present

## 2014-11-09 DIAGNOSIS — Z0000 Encounter for general adult medical examination without abnormal findings: Secondary | ICD-10-CM

## 2014-11-09 LAB — CBC WITH DIFFERENTIAL/PLATELET
BASOS PCT: 0.5 % (ref 0.0–3.0)
Basophils Absolute: 0 10*3/uL (ref 0.0–0.1)
EOS ABS: 0 10*3/uL (ref 0.0–0.7)
EOS PCT: 0.7 % (ref 0.0–5.0)
HEMATOCRIT: 48.8 % (ref 39.0–52.0)
Hemoglobin: 16.5 g/dL (ref 13.0–17.0)
LYMPHS ABS: 2 10*3/uL (ref 0.7–4.0)
LYMPHS PCT: 31.9 % (ref 12.0–46.0)
MCHC: 33.8 g/dL (ref 30.0–36.0)
MCV: 91 fl (ref 78.0–100.0)
Monocytes Absolute: 0.4 10*3/uL (ref 0.1–1.0)
Monocytes Relative: 7.2 % (ref 3.0–12.0)
NEUTROS ABS: 3.7 10*3/uL (ref 1.4–7.7)
Neutrophils Relative %: 59.7 % (ref 43.0–77.0)
Platelets: 233 10*3/uL (ref 150.0–400.0)
RBC: 5.36 Mil/uL (ref 4.22–5.81)
RDW: 13.7 % (ref 11.5–15.5)
WBC: 6.2 10*3/uL (ref 4.0–10.5)

## 2014-11-09 LAB — LIPID PANEL
CHOL/HDL RATIO: 3
CHOLESTEROL: 229 mg/dL — AB (ref 0–200)
HDL: 66 mg/dL (ref >39.00)
LDL CALC: 146 mg/dL — AB (ref 0–99)
NonHDL: 163
TRIGLYCERIDES: 83 mg/dL (ref 0.0–149.0)
VLDL: 16.6 mg/dL (ref 0.0–40.0)

## 2014-11-09 LAB — COMPREHENSIVE METABOLIC PANEL
ALBUMIN: 4.4 g/dL (ref 3.5–5.2)
ALK PHOS: 49 U/L (ref 39–117)
ALT: 20 U/L (ref 0–53)
AST: 24 U/L (ref 0–37)
BILIRUBIN TOTAL: 1.3 mg/dL — AB (ref 0.2–1.2)
BUN: 18 mg/dL (ref 6–23)
CO2: 27 meq/L (ref 19–32)
Calcium: 9.9 mg/dL (ref 8.4–10.5)
Chloride: 104 mEq/L (ref 96–112)
Creatinine, Ser: 1.09 mg/dL (ref 0.40–1.50)
GFR: 72.55 mL/min (ref >60.00)
GLUCOSE: 102 mg/dL — AB (ref 70–99)
POTASSIUM: 4.1 meq/L (ref 3.5–5.1)
Sodium: 137 mEq/L (ref 135–145)
Total Protein: 6.8 g/dL (ref 6.0–8.3)

## 2014-11-09 LAB — PSA: PSA: 0.74 ng/mL (ref 0.10–4.00)

## 2014-11-09 LAB — POCT URINALYSIS DIPSTICK
Bilirubin, UA: NEGATIVE
Glucose, UA: NEGATIVE
Ketones, UA: NEGATIVE
Leukocytes, UA: NEGATIVE
Nitrite, UA: NEGATIVE
PROTEIN UA: NEGATIVE
RBC UA: NEGATIVE
SPEC GRAV UA: 1.02
Urobilinogen, UA: 0.2
pH, UA: 6

## 2014-11-09 LAB — TSH: TSH: 2.34 u[IU]/mL (ref 0.35–4.50)

## 2014-11-16 ENCOUNTER — Ambulatory Visit (INDEPENDENT_AMBULATORY_CARE_PROVIDER_SITE_OTHER): Payer: No Typology Code available for payment source | Admitting: Internal Medicine

## 2014-11-16 ENCOUNTER — Encounter: Payer: Self-pay | Admitting: Internal Medicine

## 2014-11-16 VITALS — BP 130/90 | HR 68 | Temp 99.0°F | Ht 68.0 in | Wt 179.0 lb

## 2014-11-16 DIAGNOSIS — Z87442 Personal history of urinary calculi: Secondary | ICD-10-CM

## 2014-11-16 DIAGNOSIS — K5730 Diverticulosis of large intestine without perforation or abscess without bleeding: Secondary | ICD-10-CM

## 2014-11-16 DIAGNOSIS — Z0000 Encounter for general adult medical examination without abnormal findings: Secondary | ICD-10-CM | POA: Diagnosis not present

## 2014-11-16 MED ORDER — LORAZEPAM 0.5 MG PO TABS: 0.5000 mg | ORAL_TABLET | Freq: Every evening | ORAL | Status: DC | PRN

## 2014-11-16 NOTE — Patient Instructions (Signed)
It is important that you exercise regularly, at least 20 minutes 3 to 4 times per week.  If you develop chest pain or shortness of breath seek  medical attention.  Health Maintenance A healthy lifestyle and preventative care can promote health and wellness.  Maintain regular health, dental, and eye exams.  Eat a healthy diet. Foods like vegetables, fruits, whole grains, low-fat dairy products, and lean protein foods contain the nutrients you need and are low in calories. Decrease your intake of foods high in solid fats, added sugars, and salt. Get information about a proper diet from your health care provider, if necessary.  Regular physical exercise is one of the most important things you can do for your health. Most adults should get at least 150 minutes of moderate-intensity exercise (any activity that increases your heart rate and causes you to sweat) each week. In addition, most adults need muscle-strengthening exercises on 2 or more days a week.   Maintain a healthy weight. The body mass index (BMI) is a screening tool to identify possible weight problems. It provides an estimate of body fat based on height and weight. Your health care provider can find your BMI and can help you achieve or maintain a healthy weight. For males 20 years and older:  A BMI below 18.5 is considered underweight.  A BMI of 18.5 to 24.9 is normal.  A BMI of 25 to 29.9 is considered overweight.  A BMI of 30 and above is considered obese.  Maintain normal blood lipids and cholesterol by exercising and minimizing your intake of saturated fat. Eat a balanced diet with plenty of fruits and vegetables. Blood tests for lipids and cholesterol should begin at age 20 and be repeated every 5 years. If your lipid or cholesterol levels are high, you are over age 50, or you are at high risk for heart disease, you may need your cholesterol levels checked more frequently.Ongoing high lipid and cholesterol levels should be  treated with medicines if diet and exercise are not working.  If you smoke, find out from your health care provider how to quit. If you do not use tobacco, do not start.  Lung cancer screening is recommended for adults aged 55-80 years who are at high risk for developing lung cancer because of a history of smoking. A yearly low-dose CT scan of the lungs is recommended for people who have at least a 30-pack-year history of smoking and are current smokers or have quit within the past 15 years. A pack year of smoking is smoking an average of 1 pack of cigarettes a day for 1 year (for example, a 30-pack-year history of smoking could mean smoking 1 pack a day for 30 years or 2 packs a day for 15 years). Yearly screening should continue until the smoker has stopped smoking for at least 15 years. Yearly screening should be stopped for people who develop a health problem that would prevent them from having lung cancer treatment.  If you choose to drink alcohol, do not have more than 2 drinks per day. One drink is considered to be 12 oz (360 mL) of beer, 5 oz (150 mL) of wine, or 1.5 oz (45 mL) of liquor.  Avoid the use of street drugs. Do not share needles with anyone. Ask for help if you need support or instructions about stopping the use of drugs.  High blood pressure causes heart disease and increases the risk of stroke. Blood pressure should be checked at least every   1-2 years. Ongoing high blood pressure should be treated with medicines if weight loss and exercise are not effective.  If you are 45-79 years old, ask your health care provider if you should take aspirin to prevent heart disease.  Diabetes screening involves taking a blood sample to check your fasting blood sugar level. This should be done once every 3 years after age 45 if you are at a normal weight and without risk factors for diabetes. Testing should be considered at a younger age or be carried out more frequently if you are overweight and  have at least 1 risk factor for diabetes.  Colorectal cancer can be detected and often prevented. Most routine colorectal cancer screening begins at the age of 50 and continues through age 75. However, your health care provider may recommend screening at an earlier age if you have risk factors for colon cancer. On a yearly basis, your health care provider may provide home test kits to check for hidden blood in the stool. A small camera at the end of a tube may be used to directly examine the colon (sigmoidoscopy or colonoscopy) to detect the earliest forms of colorectal cancer. Talk to your health care provider about this at age 50 when routine screening begins. A direct exam of the colon should be repeated every 5-10 years through age 75, unless early forms of precancerous polyps or small growths are found.  People who are at an increased risk for hepatitis B should be screened for this virus. You are considered at high risk for hepatitis B if:  You were born in a country where hepatitis B occurs often. Talk with your health care provider about which countries are considered high risk.  Your parents were born in a high-risk country and you have not received a shot to protect against hepatitis B (hepatitis B vaccine).  You have HIV or AIDS.  You use needles to inject street drugs.  You live with, or have sex with, someone who has hepatitis B.  You are a man who has sex with other men (MSM).  You get hemodialysis treatment.  You take certain medicines for conditions like cancer, organ transplantation, and autoimmune conditions.  Hepatitis C blood testing is recommended for all people born from 1945 through 1965 and any individual with known risk factors for hepatitis C.  Healthy men should no longer receive prostate-specific antigen (PSA) blood tests as part of routine cancer screening. Talk to your health care provider about prostate cancer screening.  Testicular cancer screening is not  recommended for adolescents or adult males who have no symptoms. Screening includes self-exam, a health care provider exam, and other screening tests. Consult with your health care provider about any symptoms you have or any concerns you have about testicular cancer.  Practice safe sex. Use condoms and avoid high-risk sexual practices to reduce the spread of sexually transmitted infections (STIs).  You should be screened for STIs, including gonorrhea and chlamydia if:  You are sexually active and are younger than 24 years.  You are older than 24 years, and your health care provider tells you that you are at risk for this type of infection.  Your sexual activity has changed since you were last screened, and you are at an increased risk for chlamydia or gonorrhea. Ask your health care provider if you are at risk.  If you are at risk of being infected with HIV, it is recommended that you take a prescription medicine daily to prevent HIV   infection. This is called pre-exposure prophylaxis (PrEP). You are considered at risk if:  You are a man who has sex with other men (MSM).  You are a heterosexual man who is sexually active with multiple partners.  You take drugs by injection.  You are sexually active with a partner who has HIV.  Talk with your health care provider about whether you are at high risk of being infected with HIV. If you choose to begin PrEP, you should first be tested for HIV. You should then be tested every 3 months for as long as you are taking PrEP.  Use sunscreen. Apply sunscreen liberally and repeatedly throughout the day. You should seek shade when your shadow is shorter than you. Protect yourself by wearing long sleeves, pants, a wide-brimmed hat, and sunglasses year round whenever you are outdoors.  Tell your health care provider of new moles or changes in moles, especially if there is a change in shape or color. Also, tell your health care provider if a mole is larger  than the size of a pencil eraser.  A one-time screening for abdominal aortic aneurysm (AAA) and surgical repair of large AAAs by ultrasound is recommended for men aged 65-75 years who are current or former smokers.  Stay current with your vaccines (immunizations). Document Released: 11/21/2007 Document Revised: 05/30/2013 Document Reviewed: 10/20/2010 Exit[employer] Patient Information 2015 [employer]. This information is not intended to replace advice given to you by your health care provider. Make sure you discuss any questions you have with your health care provider.  

## 2014-11-16 NOTE — Progress Notes (Signed)
Subjective:    Patient ID: Robert Cowan, male    DOB: 10-25-1950, 64 y.o.   MRN: [id number]  HPI   53 -year-old patient who is seen today for a preventive health examination.   He continues to do quite well.  He did have a stress echocardiogram in December of 2013.  This was normal.  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  He has a family history of colon cancer.  His mother died at age 1 of complications of colon cancer.  His last colonoscopy was in June of 2012.  He has a history of nephrolithiasis, which has been stable.  He has mild gastro-esophageal reflux disease.  No new concerns or complaints  Past Medical History  Diagnosis Date  . DIVERTICULOSIS, COLON 02/02/2007  . GERD 01/28/2007  . KIDNEY STONE 04/19/2008  . 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 RENAL CALCULUS, LEFT 04/19/2008  . 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 Insomnia   . Melanoma     History   Social History  . Marital Status: Single    Spouse Name: N/A  . Number of Children: N/A  . Years of Education: N/A   Occupational History  . Not on file.   Social History Main Topics  . Smoking status: Never Smoker   . Smokeless tobacco: Never Used  . Alcohol Use: 2.0 oz/week    4 drink(s) per week  . Drug Use: No  . Sexual Activity: Not on file   Other Topics Concern  . Not on file   Social History Narrative   Single   Gets regular exercise    Past Surgical History  Procedure Laterality Date  . Melanoma excision      left cheek/face  . Nose surgery      Deviated Septum  . Colonoscopy      Family History  Problem Relation Age of Onset  . Colon cancer Mother   . Cancer Mother     colon    Allergies  Allergen Reactions  . Tetracycline Hcl     REACTION: blister on genitals    Current Outpatient Prescriptions on File Prior to Visit  Medication Sig Dispense Refill  . acyclovir (ZOVIRAX) 200 MG capsule AS DIRECTED 90 capsule 3  . aspirin 81 MG  tablet Take 81 mg by mouth daily.      Marland Kitchen CIALIS 20 MG tablet TAKE 1 TABLET BY MOUTH EVERY DAY AS NEEDED 10 tablet 2  . diphenhydramine-acetaminophen (TYLENOL PM) 25-500 MG TABS Take 1 tablet by mouth at bedtime as needed. sleep     . Flaxseed, Linseed, (FLAXSEED OIL PO) Take 1,000 mg by mouth daily.      Marland Kitchen LORazepam (ATIVAN) 0.5 MG tablet TAKE 1 TABLET AT BEDTIME AS NEEDED 60 tablet 1  . Multiple Vitamin (MULTI-DAY VITAMINS PO) Take by mouth daily.      Marland Kitchen omeprazole (PRILOSEC) 20 MG capsule TAKE 1 CAPSULE (20 MG TOTAL) BY MOUTH DAILY. 90 capsule 3   No current facility-administered medications on file prior to visit.    BP 130/90 mmHg  Pulse 68  Temp(Src) 99 F (37.2 C) (Oral)  Ht 5\' 8"  (1.727 m)  Wt 179 lb (81.194 kg)  BMI 27.22 kg/m2     Review of Systems  Constitutional: Negative for fever, chills, activity change, appetite change and fatigue.  HENT: Negative for congestion, dental problem, ear pain, hearing loss, mouth sores, rhinorrhea, sinus pressure, sneezing, tinnitus, trouble swallowing and voice  change.   Eyes: Negative for photophobia, pain, redness and visual disturbance.  Respiratory: Negative for apnea, cough, choking, chest tightness, shortness of breath and wheezing.   Cardiovascular: Negative for chest pain, palpitations and leg swelling.  Gastrointestinal: Negative for nausea, vomiting, abdominal pain, diarrhea, constipation, blood in stool, abdominal distention, anal bleeding and rectal pain.  Genitourinary: Negative for dysuria, urgency, frequency, hematuria, flank pain, decreased urine volume, discharge, penile swelling, scrotal swelling, difficulty urinating, genital sores and testicular pain.  Musculoskeletal: Negative for myalgias, back pain, joint swelling, arthralgias, gait problem, neck pain and neck stiffness.  Skin: Negative for color change, rash and wound.  Neurological: Negative for dizziness, tremors, seizures, syncope, facial asymmetry, speech  difficulty, weakness, light-headedness, numbness and headaches.  Hematological: Negative for adenopathy. Does not bruise/bleed easily.  Psychiatric/Behavioral: Negative for suicidal ideas, hallucinations, behavioral problems, confusion, sleep disturbance, self-injury, dysphoric mood, decreased concentration and agitation. The patient is not nervous/anxious.        Objective:   Physical Exam  Constitutional: He appears well-developed and well-nourished.  HENT:  Head: Normocephalic and atraumatic.  Right Ear: External ear normal.  Left Ear: External ear normal.  Nose: Nose normal.  Mouth/Throat: Oropharynx is clear and moist.  Eyes: Conjunctivae and EOM are normal. Pupils are equal, round, and reactive to light. No scleral icterus.  Neck: Normal range of motion. Neck supple. No JVD present. No thyromegaly present.  Cardiovascular: Regular rhythm, normal heart sounds and intact distal pulses.  Exam reveals no gallop and no friction rub.   No murmur heard. Pulmonary/Chest: Effort normal and breath sounds normal. He exhibits no tenderness.  Abdominal: Soft. Bowel sounds are normal. He exhibits no distension and no mass. There is no tenderness.  Genitourinary: Penis normal.  Musculoskeletal: Normal range of motion. He exhibits no edema or tenderness.  Lymphadenopathy:    He has no cervical adenopathy.  Neurological: He is alert. He has normal reflexes. No cranial nerve deficit. Coordination normal.  Skin: Skin is warm and dry. No rash noted.  Psychiatric: He has a normal mood and affect. His behavior is normal.          Assessment & Plan:   Preventive health examination Family history of colon cancer.  Continue colonoscopies every 5 years Nephrolithiasis.  Asymptomatic Abnormal EKG with left anterior hemiblock.  Normal stress echo  Recheck one year

## 2014-11-16 NOTE — Progress Notes (Signed)
Pre visit review using our clinic review tool, if applicable. No additional management support is needed unless otherwise documented below in the visit note. 

## 2014-12-01 ENCOUNTER — Other Ambulatory Visit: Payer: Self-pay | Admitting: Internal Medicine

## 2014-12-12 ENCOUNTER — Other Ambulatory Visit: Payer: Self-pay | Admitting: Internal Medicine

## 2015-02-05 ENCOUNTER — Other Ambulatory Visit: Payer: Self-pay | Admitting: Internal Medicine

## 2015-02-08 ENCOUNTER — Other Ambulatory Visit: Payer: Self-pay | Admitting: Internal Medicine

## 2015-02-14 ENCOUNTER — Other Ambulatory Visit: Payer: Self-pay | Admitting: Internal Medicine

## 2015-06-14 ENCOUNTER — Other Ambulatory Visit: Payer: Self-pay | Admitting: Internal Medicine

## 2015-06-15 ENCOUNTER — Other Ambulatory Visit: Payer: Self-pay | Admitting: Internal Medicine

## 2015-07-03 ENCOUNTER — Other Ambulatory Visit: Payer: Self-pay | Admitting: Internal Medicine

## 2015-09-13 ENCOUNTER — Other Ambulatory Visit: Payer: Self-pay | Admitting: Internal Medicine

## 2015-10-11 ENCOUNTER — Other Ambulatory Visit: Payer: Self-pay | Admitting: Internal Medicine

## 2015-10-13 ENCOUNTER — Other Ambulatory Visit: Payer: Self-pay | Admitting: Internal Medicine

## 2015-10-14 ENCOUNTER — Encounter: Payer: Self-pay | Admitting: Gastroenterology

## 2015-10-28 ENCOUNTER — Encounter: Payer: Self-pay | Admitting: Gastroenterology

## 2015-10-30 ENCOUNTER — Ambulatory Visit (AMBULATORY_SURGERY_CENTER): Payer: Self-pay

## 2015-10-30 VITALS — Ht 68.5 in | Wt 176.2 lb

## 2015-10-30 DIAGNOSIS — Z800 Family history of malignant neoplasm of digestive organs: Secondary | ICD-10-CM

## 2015-10-30 NOTE — Progress Notes (Signed)
No allergies to eggs or soy No diet meds No home oxygen No past problems with anesthesia  Has email and internet; declined emmi 

## 2015-11-15 ENCOUNTER — Ambulatory Visit (AMBULATORY_SURGERY_CENTER): Payer: No Typology Code available for payment source | Admitting: Gastroenterology

## 2015-11-15 ENCOUNTER — Encounter: Payer: Self-pay | Admitting: Gastroenterology

## 2015-11-15 VITALS — BP 120/72 | HR 63 | Temp 96.9°F | Resp 28 | Ht 68.5 in | Wt 176.0 lb

## 2015-11-15 DIAGNOSIS — Z800 Family history of malignant neoplasm of digestive organs: Secondary | ICD-10-CM

## 2015-11-15 DIAGNOSIS — Z1211 Encounter for screening for malignant neoplasm of colon: Secondary | ICD-10-CM

## 2015-11-15 MED ORDER — SODIUM CHLORIDE 0.9 % IV SOLN: 500.0000 mL | INTRAVENOUS | Status: DC

## 2015-11-15 NOTE — Patient Instructions (Signed)
YOU HAD AN ENDOSCOPIC PROCEDURE TODAY AT THE [facility]:   Refer to the procedure report that was given to you for any specific questions about what was found during the examination.  If the procedure report does not answer your questions, please call your gastroenterologist to clarify.  If you requested that your care partner not be given the details of your procedure findings, then the procedure report has been included in a sealed envelope for you to review at your convenience later.  YOU SHOULD EXPECT: Some feelings of bloating in the abdomen. Passage of more gas than usual.  Walking can help get rid of the air that was put into your GI tract during the procedure and reduce the bloating. If you had a lower endoscopy (such as a colonoscopy or flexible sigmoidoscopy) you may notice spotting of blood in your stool or on the toilet paper. If you underwent a bowel prep for your procedure, you may not have a normal bowel movement for a few days.  Please Note:  You might notice some irritation and congestion in your nose or some drainage.  This is from the oxygen used during your procedure.  There is no need for concern and it should clear up in a day or so.  SYMPTOMS TO REPORT IMMEDIATELY:   Following lower endoscopy (colonoscopy or flexible sigmoidoscopy):  Excessive amounts of blood in the stool  Significant tenderness or worsening of abdominal pains  Swelling of the abdomen that is new, acute  Fever of 100F or higher   For urgent or emergent issues, a gastroenterologist can be reached at any hour by calling [phone].   DIET: Your first meal following the procedure should be a small meal and then it is ok to progress to your normal diet. Heavy or fried foods are harder to digest and may make you feel nauseous or bloated.  Likewise, meals heavy in dairy and vegetables can increase bloating.  Drink plenty of fluids but you should avoid alcoholic beverages for 24  hours.  ACTIVITY:  You should plan to take it easy for the rest of today and you should NOT DRIVE or use heavy machinery until tomorrow (because of the sedation medicines used during the test).    FOLLOW UP: Our staff will call the number listed on your records the next business day following your procedure to check on you and address any questions or concerns that you may have regarding the information given to you following your procedure. If we do not reach you, we will leave a message.  However, if you are feeling well and you are not experiencing any problems, there is no need to return our call.  We will assume that you have returned to your regular daily activities without incident.  If any biopsies were taken you will be contacted by phone or by letter within the next 1-3 weeks.  Please call us at [phone] if you have not heard about the biopsies in 3 weeks.    SIGNATURES/CONFIDENTIALITY: You and/or your care partner have signed paperwork which will be entered into your electronic medical record.  These signatures attest to the fact that that the information above on your After Visit Summary has been reviewed and is understood.  Full responsibility of the confidentiality of this discharge information lies with you and/or your care-partner.  Diverticulosis and high fiber diet information given. 

## 2015-11-15 NOTE — Op Note (Signed)
[facility] Patient Name: Robert Cowan Procedure Date: 11/15/2015 8:35 AM MRN: [id number] Endoscopist: [place]. Loletha Carrow , MD Age: 65 Referring MD:  Date of Birth: 13-Jan-1951 Gender: Male Procedure:                Colonoscopy Indications:              Screening in patient at increased risk: Colorectal                            cancer in mother before age 27 Medicines:                Monitored Anesthesia Care Procedure:                Pre-Anesthesia Assessment:                           - Prior to the procedure, a History and Physical                            was performed, and patient medications and                            allergies were reviewed. The patient's tolerance of                            previous anesthesia was also reviewed. The risks                            and benefits of the procedure and the sedation                            options and risks were discussed with the patient.                            All questions were answered, and informed consent                            was obtained. Prior Anticoagulants: The patient has                            taken no previous anticoagulant or antiplatelet                            agents. ASA Grade Assessment: II - A patient with                            mild systemic disease. After reviewing the risks                            and benefits, the patient was deemed in                            satisfactory condition to undergo the procedure.  After obtaining informed consent, the colonoscope                            was passed under direct vision. Throughout the                            procedure, the patient's blood pressure, pulse, and                            oxygen saturations were monitored continuously. The                            Model CF-HQ190L [id number]) scope was introduced                            through the anus and advanced to the the cecum,                        identified by appendiceal orifice and ileocecal                            valve. The colonoscopy was performed without                            difficulty. The patient tolerated the procedure                            well. The quality of the bowel preparation was                            excellent. The ileocecal valve, appendiceal                            orifice, and rectum were photographed. Scope In: 8:44:13 AM Scope Out: [id number] AM Scope Withdrawal Time: 0 hours 10 minutes 42 seconds  Total Procedure Duration: 0 hours 12 minutes 19 seconds  Findings:                 The perianal and digital rectal examinations were                            normal.                           Multiple medium-mouthed diverticula were found in                            the proximal ascending colon and left colon.                           The exam was otherwise without abnormality on                            direct and retroflexion views. Complications:            No immediate complications. Estimated Blood Loss:  Estimated blood loss: none. Impression:               - Diverticulosis in the proximal ascending colon                            and in the left colon.                           - The examination was otherwise normal on direct                            and retroflexion views.                           - No specimens collected. Recommendation:           - Patient has a contact number available for                            emergencies. The signs and symptoms of potential                            delayed complications were discussed with the                            patient. Return to normal activities tomorrow.                            Written discharge instructions were provided to the                            patient.                           - Resume previous diet.                           - Continue present medications.                            - Repeat colonoscopy in 5 years for screening                            purposes. Krislynn Gronau L. Loletha Carrow, MD 11/15/2015 9:00:26 AM This report has been signed electronically.

## 2015-11-15 NOTE — Progress Notes (Signed)
Patient awakening,vss,report to rn 

## 2015-11-18 ENCOUNTER — Telehealth: Payer: Self-pay

## 2015-11-18 NOTE — Telephone Encounter (Signed)
  Follow up Call-  Call back number 11/15/2015  Post procedure Call Back phone  # [phone]  Permission to leave phone message Yes     Patient questions:  Do you have a fever, pain , or abdominal swelling? No. Pain Score  0 *  Have you tolerated food without any problems? Yes.    Have you been able to return to your normal activities? Yes.    Do you have any questions about your discharge instructions: Diet   No. Medications  No. Follow up visit  No.  Do you have questions or concerns about your Care? No.  Actions: * If pain score is 4 or above: No action needed, pain <4.

## 2016-01-21 ENCOUNTER — Other Ambulatory Visit (INDEPENDENT_AMBULATORY_CARE_PROVIDER_SITE_OTHER): Payer: No Typology Code available for payment source

## 2016-01-21 DIAGNOSIS — Z0000 Encounter for general adult medical examination without abnormal findings: Secondary | ICD-10-CM | POA: Diagnosis not present

## 2016-01-21 LAB — HEPATIC FUNCTION PANEL
ALBUMIN: 4.4 g/dL (ref 3.5–5.2)
ALK PHOS: 45 U/L (ref 39–117)
ALT: 15 U/L (ref 0–53)
AST: 21 U/L (ref 0–37)
Bilirubin, Direct: 0.2 mg/dL (ref 0.0–0.3)
Total Bilirubin: 1.1 mg/dL (ref 0.2–1.2)
Total Protein: 6.6 g/dL (ref 6.0–8.3)

## 2016-01-21 LAB — POC URINALSYSI DIPSTICK (AUTOMATED)
BILIRUBIN UA: NEGATIVE
GLUCOSE UA: NEGATIVE
Ketones, UA: NEGATIVE
Leukocytes, UA: NEGATIVE
Nitrite, UA: NEGATIVE
PH UA: 6
Protein, UA: NEGATIVE
RBC UA: NEGATIVE
SPEC GRAV UA: 1.02
Urobilinogen, UA: 0.2

## 2016-01-21 LAB — CBC WITH DIFFERENTIAL/PLATELET
BASOS ABS: 0.1 10*3/uL (ref 0.0–0.1)
Basophils Relative: 1 % (ref 0.0–3.0)
EOS PCT: 1.7 % (ref 0.0–5.0)
Eosinophils Absolute: 0.1 10*3/uL (ref 0.0–0.7)
HEMATOCRIT: 47.8 % (ref 39.0–52.0)
HEMOGLOBIN: 16.3 g/dL (ref 13.0–17.0)
LYMPHS PCT: 44.9 % (ref 12.0–46.0)
Lymphs Abs: 2.7 10*3/uL (ref 0.7–4.0)
MCHC: 34 g/dL (ref 30.0–36.0)
MCV: 90.6 fl (ref 78.0–100.0)
MONOS PCT: 7.4 % (ref 3.0–12.0)
Monocytes Absolute: 0.4 10*3/uL (ref 0.1–1.0)
NEUTROS PCT: 45 % (ref 43.0–77.0)
Neutro Abs: 2.7 10*3/uL (ref 1.4–7.7)
Platelets: 218 10*3/uL (ref 150.0–400.0)
RBC: 5.28 Mil/uL (ref 4.22–5.81)
RDW: 13.3 % (ref 11.5–15.5)
WBC: 5.9 10*3/uL (ref 4.0–10.5)

## 2016-01-21 LAB — BASIC METABOLIC PANEL
BUN: 18 mg/dL (ref 6–23)
CALCIUM: 9.9 mg/dL (ref 8.4–10.5)
CO2: 26 mEq/L (ref 19–32)
Chloride: 107 mEq/L (ref 96–112)
Creatinine, Ser: 1.04 mg/dL (ref 0.40–1.50)
GFR: 76.3 mL/min (ref >60.00)
Glucose, Bld: 105 mg/dL — ABNORMAL HIGH (ref 70–99)
POTASSIUM: 4.2 meq/L (ref 3.5–5.1)
SODIUM: 141 meq/L (ref 135–145)

## 2016-01-21 LAB — LIPID PANEL
Cholesterol: 224 mg/dL — ABNORMAL HIGH (ref 0–200)
HDL: 70.4 mg/dL (ref >39.00)
LDL Cholesterol: 139 mg/dL — ABNORMAL HIGH (ref 0–99)
NONHDL: 153.26
Total CHOL/HDL Ratio: 3
Triglycerides: 70 mg/dL (ref 0.0–149.0)
VLDL: 14 mg/dL (ref 0.0–40.0)

## 2016-01-21 LAB — TSH: TSH: 3.51 u[IU]/mL (ref 0.35–4.50)

## 2016-01-21 LAB — PSA: PSA: 1.23 ng/mL (ref 0.10–4.00)

## 2016-01-28 ENCOUNTER — Other Ambulatory Visit: Payer: No Typology Code available for payment source

## 2016-02-04 ENCOUNTER — Ambulatory Visit (INDEPENDENT_AMBULATORY_CARE_PROVIDER_SITE_OTHER): Payer: No Typology Code available for payment source | Admitting: Internal Medicine

## 2016-02-04 ENCOUNTER — Encounter: Payer: Self-pay | Admitting: Internal Medicine

## 2016-02-04 VITALS — BP 130/90 | HR 69 | Temp 98.2°F | Resp 20 | Ht 68.0 in | Wt 179.0 lb

## 2016-02-04 DIAGNOSIS — M25512 Pain in left shoulder: Secondary | ICD-10-CM

## 2016-02-04 DIAGNOSIS — Z0001 Encounter for general adult medical examination with abnormal findings: Secondary | ICD-10-CM

## 2016-02-04 DIAGNOSIS — Z0000 Encounter for general adult medical examination without abnormal findings: Secondary | ICD-10-CM

## 2016-02-04 MED ORDER — METHYLPREDNISOLONE ACETATE 80 MG/ML IJ SUSP
80.0000 mg | Freq: Once | INTRAMUSCULAR | Status: AC
  Administered 2016-02-04: 80 mg via INTRAMUSCULAR

## 2016-02-04 NOTE — Patient Instructions (Addendum)
It is important that you exercise regularly, at least 20 minutes 3 to 4 times per week.  If you develop chest pain or shortness of breath seek  medical attention.  Return in one year for follow-up   Health Maintenance, Male A healthy lifestyle and preventative care can promote health and wellness.  Maintain regular health, dental, and eye exams.  Eat a healthy diet. Foods like vegetables, fruits, whole grains, low-fat dairy products, and lean protein foods contain the nutrients you need and are low in calories. Decrease your intake of foods high in solid fats, added sugars, and salt. Get information about a proper diet from your health care provider, if necessary.  Regular physical exercise is one of the most important things you can do for your health. Most adults should get at least 150 minutes of moderate-intensity exercise (any activity that increases your heart rate and causes you to sweat) each week. In addition, most adults need muscle-strengthening exercises on 2 or more days a week.   Maintain a healthy weight. The body mass index (BMI) is a screening tool to identify possible weight problems. It provides an estimate of body fat based on height and weight. Your health care provider can find your BMI and can help you achieve or maintain a healthy weight. For males 20 years and older:  A BMI below 18.5 is considered underweight.  A BMI of 18.5 to 24.9 is normal.  A BMI of 25 to 29.9 is considered overweight.  A BMI of 30 and above is considered obese.  Maintain normal blood lipids and cholesterol by exercising and minimizing your intake of saturated fat. Eat a balanced diet with plenty of fruits and vegetables. Blood tests for lipids and cholesterol should begin at age 58 and be repeated every 5 years. If your lipid or cholesterol levels are high, you are over age 47, or you are at high risk for heart disease, you may need your cholesterol levels checked more frequently.Ongoing  high lipid and cholesterol levels should be treated with medicines if diet and exercise are not working.  If you smoke, find out from your health care provider how to quit. If you do not use tobacco, do not start.  Lung cancer screening is recommended for adults aged 39-80 years who are at high risk for developing lung cancer because of a history of smoking. A yearly low-dose CT scan of the lungs is recommended for people who have at least a 30-pack-year history of smoking and are current smokers or have quit within the past 15 years. A pack year of smoking is smoking an average of 1 pack of cigarettes a day for 1 year (for example, a 30-pack-year history of smoking could mean smoking 1 pack a day for 30 years or 2 packs a day for 15 years). Yearly screening should continue until the smoker has stopped smoking for at least 15 years. Yearly screening should be stopped for people who develop a health problem that would prevent them from having lung cancer treatment.  If you choose to drink alcohol, do not have more than 2 drinks per day. One drink is considered to be 12 oz (360 mL) of beer, 5 oz (150 mL) of wine, or 1.5 oz (45 mL) of liquor.  Avoid the use of street drugs. Do not share needles with anyone. Ask for help if you need support or instructions about stopping the use of drugs.  High blood pressure causes heart disease and increases the risk of  stroke. High blood pressure is more likely to develop in:  People who have blood pressure in the end of the normal range (100-139/85-89 mm Hg).  People who are overweight or obese.  People who are African American.  If you are 3-41 years of age, have your blood pressure checked every 3-5 years. If you are 28 years of age or older, have your blood pressure checked every year. You should have your blood pressure measured twice--once when you are at a hospital or clinic, and once when you are not at a hospital or clinic. Record the average of the two  measurements. To check your blood pressure when you are not at a hospital or clinic, you can use:  An automated blood pressure machine at a pharmacy.  A home blood pressure monitor.  If you are 39-74 years old, ask your health care provider if you should take aspirin to prevent heart disease.  Diabetes screening involves taking a blood sample to check your fasting blood sugar level. This should be done once every 3 years after age 73 if you are at a normal weight and without risk factors for diabetes. Testing should be considered at a younger age or be carried out more frequently if you are overweight and have at least 1 risk factor for diabetes.  Colorectal cancer can be detected and often prevented. Most routine colorectal cancer screening begins at the age of 41 and continues through age 60. However, your health care provider may recommend screening at an earlier age if you have risk factors for colon cancer. On a yearly basis, your health care provider may provide home test kits to check for hidden blood in the stool. A small camera at the end of a tube may be used to directly examine the colon (sigmoidoscopy or colonoscopy) to detect the earliest forms of colorectal cancer. Talk to your health care provider about this at age 31 when routine screening begins. A direct exam of the colon should be repeated every 5-10 years through age 46, unless early forms of precancerous polyps or small growths are found.  People who are at an increased risk for hepatitis B should be screened for this virus. You are considered at high risk for hepatitis B if:  You were born in a country where hepatitis B occurs often. Talk with your health care provider about which countries are considered high risk.  Your parents were born in a high-risk country and you have not received a shot to protect against hepatitis B (hepatitis B vaccine).  You have HIV or AIDS.  You use needles to inject street drugs.  You live  with, or have sex with, someone who has hepatitis B.  You are a man who has sex with other men (MSM).  You get hemodialysis treatment.  You take certain medicines for conditions like cancer, organ transplantation, and autoimmune conditions.  Hepatitis C blood testing is recommended for all people born from [birth year] through 1965 and any individual with known risk factors for hepatitis C.  Healthy men should no longer receive prostate-specific antigen (PSA) blood tests as part of routine cancer screening. Talk to your health care provider about prostate cancer screening.  Testicular cancer screening is not recommended for adolescents or adult males who have no symptoms. Screening includes self-exam, a health care provider exam, and other screening tests. Consult with your health care provider about any symptoms you have or any concerns you have about testicular cancer.  Practice safe sex. Use  condoms and avoid high-risk sexual practices to reduce the spread of sexually transmitted infections (STIs).  You should be screened for STIs, including gonorrhea and chlamydia if:  You are sexually active and are younger than 24 years.  You are older than 24 years, and your health care provider tells you that you are at risk for this type of infection.  Your sexual activity has changed since you were last screened, and you are at an increased risk for chlamydia or gonorrhea. Ask your health care provider if you are at risk.  If you are at risk of being infected with HIV, it is recommended that you take a prescription medicine daily to prevent HIV infection. This is called pre-exposure prophylaxis (PrEP). You are considered at risk if:  You are a man who has sex with other men (MSM).  You are a heterosexual man who is sexually active with multiple partners.  You take drugs by injection.  You are sexually active with a partner who has HIV.  Talk with your health care provider about whether you are at  high risk of being infected with HIV. If you choose to begin PrEP, you should first be tested for HIV. You should then be tested every 3 months for as long as you are taking PrEP.  Use sunscreen. Apply sunscreen liberally and repeatedly throughout the day. You should seek shade when your shadow is shorter than you. Protect yourself by wearing long sleeves, pants, a wide-brimmed hat, and sunglasses year round whenever you are outdoors.  Tell your health care provider of new moles or changes in moles, especially if there is a change in shape or color. Also, tell your health care provider if a mole is larger than the size of a pencil eraser.  A one-time screening for abdominal aortic aneurysm (AAA) and surgical repair of large AAAs by ultrasound is recommended for men aged 68-75 years who are current or former smokers.  Stay current with your vaccines (immunizations).   This information is not intended to replace advice given to you by your health care provider. Make sure you discuss any questions you have with your health care provider.   Document Released: 11/21/2007 Document Revised: 06/15/2014 Document Reviewed: 10/20/2010 Elsevier Interactive Patient Education [employer].

## 2016-02-04 NOTE — Progress Notes (Signed)
Subjective:    Patient ID: Robert Cowan, male    DOB: 06/23/1950, 65 y.o.   MRN: [id number]  HPI   65 -year-old patient who is seen today for a preventive health examination.   He continues to do quite well.  He did have a stress echocardiogram in December of 2013.  This was normal.  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  He has a family history of colon cancer.  His mother died at age 18 of complications of colon cancer.  His last colonoscopy was in June of 2012 and repeated recently.  He has a history of nephrolithiasis, which has been stable.  He has mild gastro-esophageal reflux disease.  No new concerns or complaints except for some left shoulder discomfort.  He is receiving some chiropractic care.  He is requesting a horizontal injection. He has also had a recent the shingles vaccine   Past Medical History:  Diagnosis Date  . DIVERTICULOSIS, COLON 02/02/2007  . GERD 01/28/2007  . Insomnia   . KIDNEY STONE 04/19/2008  . Melanoma ([place]) 2005  . 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 RENAL CALCULUS, LEFT 04/19/2008  . 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 Marital status: Single    Spouse name: N/A  . Number of children: N/A  . Years of education: N/A   Occupational History  . Not on file.   Social History Main Topics  . Smoking status: Never Smoker  . Smokeless tobacco: Never Used  . Alcohol use 1.8 oz/week    3 Cans of beer per week     Comment: mixture of beer, wine, liquor  . Drug use: No  . Sexual activity: Not on file   Other Topics Concern  . Not on file   Social History Narrative   Single   Gets regular exercise    Past Surgical History:  Procedure Laterality Date  . COLONOSCOPY    . MELANOMA EXCISION     left cheek/face  . NOSE SURGERY     Deviated Septum    Family History  Problem Relation Age of Onset  . Colon cancer Mother     Allergies  Allergen Reactions  .  Tetracycline Hcl     REACTION: blister on genitals    Current Outpatient Prescriptions on File Prior to Visit  Medication Sig Dispense Refill  . acyclovir (ZOVIRAX) 200 MG capsule TAKE AS DIRECTED 90 capsule 1  . aspirin 81 MG tablet Take 81 mg by mouth daily.      Marland Kitchen CIALIS 20 MG tablet TAKE 1 TABLET BY MOUTH EVERY DAY AS NEEDED 10 tablet 5  . diphenhydramine-acetaminophen (TYLENOL PM) 25-500 MG TABS Take 1 tablet by mouth at bedtime as needed. sleep     . LORazepam (ATIVAN) 0.5 MG tablet TAKE 1 TABLET BY MOUTH AT BEDTIME AS NEEDED 60 tablet 1  . Multiple Vitamin (MULTI-DAY VITAMINS PO) Take by mouth as needed.     Marland Kitchen omeprazole (PRILOSEC) 20 MG capsule TAKE 1 CAPSULE (20 MG TOTAL) BY MOUTH DAILY. 90 capsule 3  . OVER THE COUNTER MEDICATION St John's Wort, Ginseng, lecithin, hawthorn berrie,    . OVER THE COUNTER MEDICATION Co q 10 120mg  daily    . OVER THE COUNTER MEDICATION L-carnitine 1000mg  daily    . OVER THE COUNTER MEDICATION Magnesium    . polyethylene glycol powder (MIRALAX) powder Take 1 Container by mouth once. Bowel prep  colonoscopy    . vitamin E (VITAMIN E) 400 UNIT capsule Take 400 Units by mouth daily.     No current facility-administered medications on file prior to visit.     BP 130/90 (BP Location: Right Arm, Patient Position: Sitting, Cuff Size: Normal)   Pulse 69   Temp 98.2 F (36.8 C) (Oral)   Resp 20   Ht 5\' 8"  (1.727 m)   Wt 179 lb (81.2 kg)   SpO2 98%   BMI 27.22 kg/m      Review of Systems  Constitutional: Negative for activity change, appetite change, chills, fatigue and fever.  HENT: Negative for congestion, dental problem, ear pain, hearing loss, mouth sores, rhinorrhea, sinus pressure, sneezing, tinnitus, trouble swallowing and voice change.   Eyes: Negative for photophobia, pain, redness and visual disturbance.  Respiratory: Negative for apnea, cough, choking, chest tightness, shortness of breath and wheezing.   Cardiovascular: Negative for  chest pain, palpitations and leg swelling.  Gastrointestinal: Negative for abdominal distention, abdominal pain, anal bleeding, blood in stool, constipation, diarrhea, nausea, rectal pain and vomiting.  Genitourinary: Negative for decreased urine volume, difficulty urinating, discharge, dysuria, flank pain, frequency, genital sores, hematuria, penile swelling, scrotal swelling, testicular pain and urgency.  Musculoskeletal: Negative for arthralgias, back pain, gait problem, joint swelling, myalgias, neck pain and neck stiffness.  Skin: Negative for color change, rash and wound.  Neurological: Negative for dizziness, tremors, seizures, syncope, facial asymmetry, speech difficulty, weakness, light-headedness, numbness and headaches.  Hematological: Negative for adenopathy. Does not bruise/bleed easily.  Psychiatric/Behavioral: Negative for agitation, behavioral problems, confusion, decreased concentration, dysphoric mood, hallucinations, self-injury, sleep disturbance and suicidal ideas. The patient is not nervous/anxious.        Objective:   Physical Exam  Constitutional: He appears well-developed and well-nourished.  HENT:  Head: Normocephalic and atraumatic.  Right Ear: External ear normal.  Left Ear: External ear normal.  Nose: Nose normal.  Mouth/Throat: Oropharynx is clear and moist.  Eyes: Conjunctivae and EOM are normal. Pupils are equal, round, and reactive to light. No scleral icterus.  Neck: Normal range of motion. Neck supple. No JVD present. No thyromegaly present.  Cardiovascular: Regular rhythm, normal heart sounds and intact distal pulses.  Exam reveals no gallop and no friction rub.   No murmur heard. Pulmonary/Chest: Effort normal and breath sounds normal. He exhibits no tenderness.  Abdominal: Soft. Bowel sounds are normal. He exhibits no distension and no mass. There is no tenderness.  Genitourinary: Penis normal.  Musculoskeletal: Normal range of motion. He exhibits no  edema or tenderness.  Lymphadenopathy:    He has no cervical adenopathy.  Neurological: He is alert. He has normal reflexes. No cranial nerve deficit. Coordination normal.  Skin: Skin is warm and dry. No rash noted.  Psychiatric: He has a normal mood and affect. His behavior is normal.          Assessment & Plan:   Preventive health examination Family history of colon cancer.  Continue colonoscopies every 5 years Nephrolithiasis.  Asymptomatic Abnormal EKG with left anterior hemiblock.  Normal stress echo  Recheck one year \ Nyoka Cowden, MD

## 2016-02-04 NOTE — Progress Notes (Signed)
Pre visit review using our clinic review tool, if applicable. No additional management support is needed unless otherwise documented below in the visit note. 

## 2016-02-14 ENCOUNTER — Other Ambulatory Visit: Payer: Self-pay | Admitting: Internal Medicine

## 2016-02-25 NOTE — Telephone Encounter (Signed)
Erroneous encounter

## 2016-03-03 ENCOUNTER — Other Ambulatory Visit: Payer: Self-pay | Admitting: Internal Medicine

## 2016-03-13 ENCOUNTER — Other Ambulatory Visit: Payer: Self-pay | Admitting: Internal Medicine

## 2016-03-13 NOTE — Telephone Encounter (Signed)
Refilled rx omeprazole 20 mg #30. Last OV 02/04/16  Last refilled 10/24/14 #90 x 3 R. OV required for further reevaluation of medication

## 2016-06-19 ENCOUNTER — Other Ambulatory Visit: Payer: Self-pay | Admitting: Internal Medicine

## 2016-09-03 ENCOUNTER — Other Ambulatory Visit: Payer: Self-pay | Admitting: Internal Medicine

## 2016-10-06 ENCOUNTER — Telehealth: Payer: Self-pay | Admitting: Internal Medicine

## 2016-10-06 ENCOUNTER — Other Ambulatory Visit: Payer: Self-pay

## 2016-10-06 MED ORDER — LORAZEPAM 0.5 MG PO TABS: 0.5000 mg | ORAL_TABLET | Freq: Every day | ORAL | 1 refills | Status: DC

## 2016-10-06 NOTE — Telephone Encounter (Signed)
Pt is getting ready to go out of country [place] this weekend and needs a refill on lorazepam . Pt will be gone for 2 wks

## 2016-10-06 NOTE — Telephone Encounter (Signed)
Okay for refill?  

## 2016-10-06 NOTE — Telephone Encounter (Signed)
Dr. K - Please advise. Thanks! 

## 2016-10-06 NOTE — Telephone Encounter (Signed)
Rx called in to pharmacy. Pt notified. Nothing further needed at this time.

## 2016-11-03 ENCOUNTER — Encounter: Payer: Self-pay | Admitting: Internal Medicine

## 2016-11-03 ENCOUNTER — Ambulatory Visit (INDEPENDENT_AMBULATORY_CARE_PROVIDER_SITE_OTHER): Payer: Medicare Other | Admitting: Internal Medicine

## 2016-11-03 VITALS — BP 122/74 | HR 69 | Temp 98.0°F | Ht 69.0 in | Wt 177.0 lb

## 2016-11-03 DIAGNOSIS — Z0000 Encounter for general adult medical examination without abnormal findings: Secondary | ICD-10-CM

## 2016-11-03 NOTE — Patient Instructions (Addendum)
WE NOW OFFER   [facility] FAST TRACK!!!  SAME DAY Appointments for ACUTE CARE  Such as: Sprains, Injuries, cuts, abrasions, rashes, muscle pain, joint pain, back pain Colds, flu, sore throats, headache, allergies, cough, fever  Ear pain, sinus and eye infections Abdominal pain, nausea, vomiting, diarrhea, upset stomach Animal/insect bites  3 Easy Ways to Schedule: Walk-In Scheduling Call in scheduling Mychart Sign-up: [email or web address]     It is important that you exercise regularly, at least 20 minutes 3 to 4 times per week.  If you develop chest pain or shortness of breath seek  medical attention.  Return in one year for follow-up  Avoids foods high in acid such as tomatoes citrus juices, and spicy foods.  Avoid eating within two hours of lying down or before exercising.  Do not overheat.  Try smaller more frequent meals.    Consider a challenge off omeprazole  Return in one year for follow-up        Health Maintenance, Male A healthy lifestyle and preventive care is important for your health and wellness. Ask your health care provider about what schedule of regular examinations is right for you. What should I know about weight and diet?  Eat a Healthy Diet  Eat plenty of vegetables, fruits, whole grains, low-fat dairy products, and lean protein.  Do not eat a lot of foods high in solid fats, added sugars, or salt. Maintain a Healthy Weight  Regular exercise can help you achieve or maintain a healthy weight. You should:  Do at least 150 minutes of exercise each week. The exercise should increase your heart rate and make you sweat (moderate-intensity exercise).  Do strength-training exercises at least twice a week. Watch Your Levels of Cholesterol and Blood Lipids  Have your blood tested for lipids and cholesterol every 5 years starting at 65 years of age. If you are at high risk for heart disease, you should start having your blood tested  when you are 65 years old. You may need to have your cholesterol levels checked more often if:  Your lipid or cholesterol levels are high.  You are older than 65 years of age.  You are at high risk for heart disease. What should I know about cancer screening? Many types of cancers can be detected early and may often be prevented. Lung Cancer  You should be screened every year for lung cancer if:  You are a current smoker who has smoked for at least 30 years.  You are a former smoker who has quit within the past 15 years.  Talk to your health care provider about your screening options, when you should start screening, and how often you should be screened. Colorectal Cancer  Routine colorectal cancer screening usually begins at 65 years of age and should be repeated every 5-10 years until you are 65 years old. You may need to be screened more often if early forms of precancerous polyps or small growths are found. Your health care provider may recommend screening at an earlier age if you have risk factors for colon cancer.  Your health care provider may recommend using home test kits to check for hidden blood in the stool.  A small camera at the end of a tube can be used to examine your colon (sigmoidoscopy or colonoscopy). This checks for the earliest forms of colorectal cancer. Prostate and Testicular Cancer  Depending on your age and overall health, your health care provider may do certain tests to screen for prostate  and testicular cancer.  Talk to your health care provider about any symptoms or concerns you have about testicular or prostate cancer. Skin Cancer  Check your skin from head to toe regularly.  Tell your health care provider about any new moles or changes in moles, especially if:  There is a change in a mole's size, shape, or color.  You have a mole that is larger than a pencil eraser.  Always use sunscreen. Apply sunscreen liberally and repeat throughout the  day.  Protect yourself by wearing long sleeves, pants, a wide-brimmed hat, and sunglasses when outside. What should I know about heart disease, diabetes, and high blood pressure?  If you are 34-89 years of age, have your blood pressure checked every 3-5 years. If you are 37 years of age or older, have your blood pressure checked every year. You should have your blood pressure measured twice-once when you are at a hospital or clinic, and once when you are not at a hospital or clinic. Record the average of the two measurements. To check your blood pressure when you are not at a hospital or clinic, you can use:  An automated blood pressure machine at a pharmacy.  A home blood pressure monitor.  Talk to your health care provider about your target blood pressure.  If you are between 89-109 years old, ask your health care provider if you should take aspirin to prevent heart disease.  Have regular diabetes screenings by checking your fasting blood sugar level.  If you are at a normal weight and have a low risk for diabetes, have this test once every three years after the age of 30.  If you are overweight and have a high risk for diabetes, consider being tested at a younger age or more often.  A one-time screening for abdominal aortic aneurysm (AAA) by ultrasound is recommended for men aged 90-75 years who are current or former smokers. What should I know about preventing infection? Hepatitis B  If you have a higher risk for hepatitis B, you should be screened for this virus. Talk with your health care provider to find out if you are at risk for hepatitis B infection. Hepatitis C  Blood testing is recommended for:  Everyone born from [birth year] through 1964.  Anyone with known risk factors for hepatitis C. Sexually Transmitted Diseases (STDs)  You should be screened each year for STDs including gonorrhea and chlamydia if:  You are sexually active and are younger than 65 years of age.  You are  older than 65 years of age and your health care provider tells you that you are at risk for this type of infection.  Your sexual activity has changed since you were last screened and you are at an increased risk for chlamydia or gonorrhea. Ask your health care provider if you are at risk.  Talk with your health care provider about whether you are at high risk of being infected with HIV. Your health care provider may recommend a prescription medicine to help prevent HIV infection. What else can I do?  Schedule regular health, dental, and eye exams.  Stay current with your vaccines (immunizations).  Do not use any tobacco products, such as cigarettes, chewing tobacco, and e-cigarettes. If you need help quitting, ask your health care provider.  Limit alcohol intake to no more than 2 drinks per day. One drink equals 12 ounces of beer, 5 ounces of wine, or 1 ounces of hard liquor.  Do not use street drugs.  Do not share needles.  Ask your health care provider for help if you need support or information about quitting drugs.  Tell your health care provider if you often feel depressed.  Tell your health care provider if you have ever been abused or do not feel safe at home. This information is not intended to replace advice given to you by your health care provider. Make sure you discuss any questions you have with your health care provider. Document Released: 11/21/2007 Document Revised: 01/22/2016 Document Reviewed: 02/26/2015 Elsevier Interactive Patient Education  2017 [employer].

## 2016-11-03 NOTE — Progress Notes (Signed)
Subjective:    Patient ID: Robert Cowan, male    DOB: 1950-10-22, 66 y.o.   MRN: [id number]  HPI  66 year old patient, recently retired, who is seen today for initial welcome to Medicare preventive physical examination. He enjoys excellent health.  Does have a history of gastro-soft, reflux disease and does take when necessary omeprazole.  He has a history of renal stone disease.  Past Medical History:  Diagnosis Date  . DIVERTICULOSIS, COLON 02/02/2007  . GERD 01/28/2007  . Insomnia   . KIDNEY STONE 04/19/2008  . Melanoma ([place]) 2005  . 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 RENAL CALCULUS, LEFT 04/19/2008  . 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 Marital status: Single    Spouse name: N/A  . Number of children: N/A  . Years of education: N/A   Occupational History  . Not on file.   Social History Main Topics  . Smoking status: Never Smoker  . Smokeless tobacco: Never Used  . Alcohol use 1.8 oz/week    3 Cans of beer per week     Comment: mixture of beer, wine, liquor  . Drug use: No  . Sexual activity: Not on file   Other Topics Concern  . Not on file   Social History Narrative   Single   Gets regular exercise    Past Surgical History:  Procedure Laterality Date  . COLONOSCOPY    . MELANOMA EXCISION     left cheek/face  . NOSE SURGERY     Deviated Septum    Family History  Problem Relation Age of Onset  . Colon cancer Mother     Allergies  Allergen Reactions  . Tetracycline Hcl     REACTION: blister on genitals    Current Outpatient Prescriptions on File Prior to Visit  Medication Sig Dispense Refill  . acyclovir (ZOVIRAX) 200 MG capsule TAKE AS DIRECTED 90 capsule 1  . aspirin 81 MG tablet Take 81 mg by mouth daily.      Marland Kitchen CIALIS 20 MG tablet TAKE 1 TABLET BY MOUTH EVERY DAY AS NEEDED 10 tablet 5  . diphenhydramine-acetaminophen (TYLENOL PM) 25-500 MG TABS Take 1 tablet by mouth at bedtime as needed. sleep     .  LORazepam (ATIVAN) 0.5 MG tablet Take 1 tablet (0.5 mg total) by mouth at bedtime. 60 tablet 1  . Multiple Vitamin (MULTI-DAY VITAMINS PO) Take by mouth as needed.     Marland Kitchen omeprazole (PRILOSEC) 20 MG capsule TAKE 1 CAPSULE (20 MG TOTAL) BY MOUTH DAILY. 90 capsule 3  . OVER THE COUNTER MEDICATION St John's Wort, Ginseng, lecithin, hawthorn berrie,    . OVER THE COUNTER MEDICATION Co q 10 120mg  daily    . OVER THE COUNTER MEDICATION L-carnitine 1000mg  daily    . OVER THE COUNTER MEDICATION Magnesium    . polyethylene glycol powder (MIRALAX) powder Take 1 Container by mouth once. Bowel prep colonoscopy    . vitamin E (VITAMIN E) 400 UNIT capsule Take 400 Units by mouth daily.     No current facility-administered medications on file prior to visit.     BP 122/74 (BP Location: Left Arm, Patient Position: Sitting, Cuff Size: Normal)   Pulse 69   Temp 98 F (36.7 C) (Oral)   Ht 5\' 9"  (1.753 m)   Wt 177 lb (80.3 kg)   SpO2 98%   BMI 26.14 kg/m   Initial preventive welcome to Medicare visit  1. Risk  factors, based on past  M,S,F history.  No significant cardiovascular risk factors.  Does have a history of an abnormal EKG and negative nuclear stress test  2.  Physical activities:remains quite active.  Exercises daily since recent retirement  3.  Depression/mood:no history of major depression or mood disorder  4.  Hearing:no deficits  5.  ADL's:independent  6.  Fall risk:low  7.  Home safety:no problems identified  8.  Height weight, and visual acuity;height and weight stable no change in visual acuity  9.  Counseling:continue active lifestyle and rigorous exercise program  10. Lab orders based on risk factors:laboratory studies from the fall.  Reviewed.  This revealed mild impaired glucose tolerance  11. Referral :not appropriate at this time  12. Care plan:continue efforts at aggressive risk factor modification.  We'll continue colonoscopies at five-year intervals  13. Cognitive  assessment:   14. Screening: Patient provided with a written and personalized 5-10 year screening schedule in the AVS.    15. Provider List Update: primary care GI and ophthalmology  16.  Advance directives:  Patient has a simple will; no living will or health care power of attorney.          The patient was asked to follow-up with his lawyer for these directives  Review of Systems  Constitutional: Negative for appetite change, chills, fatigue and fever.  HENT: Negative for congestion, dental problem, ear pain, hearing loss, sore throat, tinnitus, trouble swallowing and voice change.   Eyes: Negative for pain, discharge and visual disturbance.  Respiratory: Negative for cough, chest tightness, wheezing and stridor.   Cardiovascular: Negative for chest pain, palpitations and leg swelling.  Gastrointestinal: Negative for abdominal distention, abdominal pain, blood in stool, constipation, diarrhea, nausea and vomiting.  Genitourinary: Negative for difficulty urinating, discharge, flank pain, genital sores, hematuria and urgency.  Musculoskeletal: Negative for arthralgias, back pain, gait problem, joint swelling, myalgias and neck stiffness.  Skin: Negative for rash.  Neurological: Negative for dizziness, syncope, speech difficulty, weakness, numbness and headaches.  Hematological: Negative for adenopathy. Does not bruise/bleed easily.  Psychiatric/Behavioral: Negative for behavioral problems and dysphoric mood. The patient is not nervous/anxious.        Objective:   Physical Exam  Constitutional: He appears well-developed and well-nourished.  HENT:  Head: Normocephalic and atraumatic.  Right Ear: External ear normal.  Left Ear: External ear normal.  Nose: Nose normal.  Mouth/Throat: Oropharynx is clear and moist.  Eyes: Conjunctivae and EOM are normal. Pupils are equal, round, and reactive to light. No scleral icterus.  Neck: Normal range of motion. Neck supple. No JVD present. No  thyromegaly present.  Cardiovascular: Regular rhythm, normal heart sounds and intact distal pulses.  Exam reveals no gallop and no friction rub.   No murmur heard. Pulmonary/Chest: Effort normal and breath sounds normal. He exhibits no tenderness.  Abdominal: Soft. Bowel sounds are normal. He exhibits no distension and no mass. There is no tenderness.  Genitourinary: Prostate normal and penis normal.  Musculoskeletal: Normal range of motion. He exhibits no edema or tenderness.  Lymphadenopathy:    He has no cervical adenopathy.  Neurological: He is alert. He has normal reflexes. No cranial nerve deficit. Coordination normal.  Skin: Skin is warm and dry. No rash noted.  Psychiatric: He has a normal mood and affect. His behavior is normal.          Assessment & Plan:   Initial preventive physical examination/welcome to Medicare visit  The patient will consider follow-up consultation  with his lawyer for completion of advanced directives Continue active lifestyle and exercise program No change in medical regimen  Follow-up in one year or as needed  Nyoka Cowden

## 2016-12-07 ENCOUNTER — Other Ambulatory Visit: Payer: Self-pay | Admitting: Internal Medicine

## 2016-12-10 ENCOUNTER — Telehealth: Payer: Self-pay | Admitting: Internal Medicine

## 2016-12-10 NOTE — Telephone Encounter (Signed)
Pt is now on medicare and cialis 20 mg is not covered. Pt would like generic cialis call [facility] [address]. Pt is aware md out of office until tomorrow

## 2016-12-11 ENCOUNTER — Other Ambulatory Visit: Payer: Self-pay | Admitting: Internal Medicine

## 2016-12-11 MED ORDER — TADALAFIL 20 MG PO TABS: 20.0000 mg | ORAL_TABLET | Freq: Every day | ORAL | 4 refills | Status: DC | PRN

## 2016-12-11 NOTE — Telephone Encounter (Signed)
There is no generic Cialis

## 2016-12-11 NOTE — Telephone Encounter (Addendum)
Pt would like a written Rx for the Cialis, so that he can send to [place].  Pt would like to know if you will mail to him at home address.Marland Kitchen

## 2016-12-11 NOTE — Telephone Encounter (Signed)
Mailed to home address per patient request. 

## 2016-12-11 NOTE — Telephone Encounter (Signed)
Prescription printed and signed

## 2016-12-11 NOTE — Telephone Encounter (Signed)
Patient is aware 

## 2017-02-12 ENCOUNTER — Other Ambulatory Visit: Payer: Self-pay | Admitting: Internal Medicine

## 2017-02-15 ENCOUNTER — Other Ambulatory Visit: Payer: Self-pay | Admitting: Internal Medicine

## 2017-02-25 ENCOUNTER — Encounter: Payer: Self-pay | Admitting: Internal Medicine

## 2017-06-10 ENCOUNTER — Ambulatory Visit: Payer: Self-pay | Admitting: *Deleted

## 2017-06-10 NOTE — Telephone Encounter (Signed)
Called in c/o headache with dizziness that has been going on for 14 days.   Got him an appt with Dr. Sarajane Jews on 06/11/17 at 1:00. Reason for Disposition . [1] New headache AND [2] age > 18  Answer Assessment - Initial Assessment Questions 1. LOCATION: "Where does it hurt?"      Hurts behind the left eye and into my left skull and base of my skull almost into my left shoulder. 2. ONSET: "When did the headache start?" (Minutes, hours or days)      14 days 3. PATTERN: "Does the pain come and go, or has it been constant since it started?"     Dull pain.  I notice it first thing in the morning and on and off during the day.   When I'm not real active I notice it more. 4. SEVERITY: "How bad is the pain?" and "What does it keep you from doing?"  (e.g., Scale 1-10; mild, moderate, or severe)   - MILD (1-3): doesn't interfere with normal activities    - MODERATE (4-7): interferes with normal activities or awakens from sleep    - SEVERE (8-10): excruciating pain, unable to do any normal activities        2-3 on the pain scale. 5. RECURRENT SYMPTOM: "Have you ever had headaches before?" If so, ask: "When was the last time?" and "What happened that time?"      No. 6. CAUSE: "What do you think is causing the headache?"     No   I don't know. 7. MIGRAINE: "Have you been diagnosed with migraine headaches?" If so, ask: "Is this headache similar?"      No   Never had this before 8. HEAD INJURY: "Has there been any recent injury to the head?"      No injuries 9. OTHER SYMPTOMS: "Do you have any other symptoms?" (fever, stiff neck, eye pain, sore throat, cold symptoms)     It started out with being dizzy with getting up or standing up.   It has gone away now.   I'm still having some problems with dizziness.  First thing in the morning I hold onto something just to make sure I don't fall. 10. PREGNANCY: "Is there any chance you are pregnant?" "When was your last menstrual period?"       N/A  Protocols used:  HEADACHE-A-AH

## 2017-06-11 ENCOUNTER — Ambulatory Visit: Payer: Medicare Other | Admitting: Family Medicine

## 2017-06-11 ENCOUNTER — Encounter: Payer: Self-pay | Admitting: Family Medicine

## 2017-06-11 VITALS — BP 130/70 | HR 75 | Temp 98.1°F | Wt 181.4 lb

## 2017-06-11 DIAGNOSIS — R29818 Other symptoms and signs involving the nervous system: Secondary | ICD-10-CM | POA: Diagnosis not present

## 2017-06-11 DIAGNOSIS — R519 Headache, unspecified: Secondary | ICD-10-CM

## 2017-06-11 DIAGNOSIS — R51 Headache: Secondary | ICD-10-CM

## 2017-06-11 LAB — BASIC METABOLIC PANEL
BUN: 17 mg/dL (ref 6–23)
CHLORIDE: 103 meq/L (ref 96–112)
CO2: 27 meq/L (ref 19–32)
CREATININE: 0.98 mg/dL (ref 0.40–1.50)
Calcium: 10 mg/dL (ref 8.4–10.5)
GFR: 81.36 mL/min (ref >60.00)
Glucose, Bld: 86 mg/dL (ref 70–99)
Potassium: 4.3 mEq/L (ref 3.5–5.1)
Sodium: 138 mEq/L (ref 135–145)

## 2017-06-11 MED ORDER — MECLIZINE HCL 25 MG PO TABS: 25.0000 mg | ORAL_TABLET | ORAL | 0 refills | Status: DC | PRN

## 2017-06-11 MED ORDER — METHYLPREDNISOLONE 4 MG PO TBPK: ORAL_TABLET | ORAL | 0 refills | Status: DC

## 2017-06-11 NOTE — Progress Notes (Signed)
   Subjective:    Patient ID: Robert Cowan, male    DOB: 1951-04-26, 67 y.o.   MRN: [id number]  HPI Here for 14 days of dizziness which makes him feel like the room is spinning when he moves his headdise to side or up and down. This settles down when he is still. No nausea. He has also had a dull left sides headache which is centered behind the left eye and which radiates across the left temple and down to the left posterior neck. No vision changes. In fact he saw his eye doctor yesterday and had a normal eye exam. No weakness or numbness of the extremities. No slurred speech.    Review of Systems  Constitutional: Negative.   HENT: Negative.   Eyes: Negative.   Respiratory: Negative.   Cardiovascular: Negative.   Neurological: Positive for dizziness and headaches. Negative for tremors, seizures, syncope, facial asymmetry, speech difficulty, weakness and numbness.       Objective:   Physical Exam  Constitutional: He is oriented to person, place, and time. He appears well-developed and well-nourished.  Cardiovascular: Normal rate, regular rhythm, normal heart sounds and intact distal pulses.  Pulmonary/Chest: Effort normal and breath sounds normal. No respiratory distress. He has no wheezes. He has no rales.  Neurological: He is alert and oriented to person, place, and time. No cranial nerve deficit. He exhibits normal muscle tone. Coordination normal.          Assessment & Plan:  Vertigo with a left sided headache. He will take a Medrol dose pack and use Meclizine prn. Set up a brain MRI soon. Alysia Penna, MD

## 2017-06-17 ENCOUNTER — Other Ambulatory Visit: Payer: Self-pay | Admitting: Internal Medicine

## 2017-06-21 ENCOUNTER — Ambulatory Visit
Admission: RE | Admit: 2017-06-21 | Discharge: 2017-06-21 | Disposition: A | Discharge status: Home / Self Care | Payer: Medicare Other | Source: Ambulatory Visit | Attending: Family Medicine | Admitting: Family Medicine

## 2017-06-21 DIAGNOSIS — R29818 Other symptoms and signs involving the nervous system: Secondary | ICD-10-CM

## 2017-06-21 MED ORDER — GADOBENATE DIMEGLUMINE 529 MG/ML IV SOLN
14.0000 mL | Freq: Once | INTRAVENOUS | Status: AC | PRN
  Administered 2017-06-21: 14 mL via INTRAVENOUS

## 2017-07-16 ENCOUNTER — Telehealth: Payer: Self-pay | Admitting: Internal Medicine

## 2017-07-16 ENCOUNTER — Encounter: Payer: Self-pay | Admitting: Internal Medicine

## 2017-07-16 NOTE — Telephone Encounter (Signed)
Copied from [place]. Topic: Quick Communication - Rx Refill/Question >> Jul 16, 2017  1:49 PM Arletha Grippe wrote: Medication: LORazepam (ATIVAN) 0.5 MG tablet    Has the patient contacted their pharmacy? No. [facility] always tells him to call us  (Agent: If no, request that the patient contact the pharmacy for the refill.)   Preferred Pharmacy (with phone number or street name): [facility] [address]   Agent: Please be advised that RX refills may take up to 3 business days. We ask that you follow-up with your pharmacy.

## 2017-07-16 NOTE — Telephone Encounter (Signed)
This encounter was created in error - please disregard.

## 2017-07-16 NOTE — Telephone Encounter (Signed)
Pt calling for a refill on his lorazepam. Last refill was on 06/18/17. LOV was 06/11/17. Please advise.

## 2017-07-20 MED ORDER — LORAZEPAM 0.5 MG PO TABS: 0.5000 mg | ORAL_TABLET | Freq: Every day | ORAL | 1 refills | Status: DC

## 2017-07-20 NOTE — Telephone Encounter (Signed)
Rx faxed to pharmacy, fax confirmation received.

## 2017-07-20 NOTE — Addendum Note (Signed)
Addended by: Dorrene German on: 07/20/2017 01:12 PM   Modules accepted: Orders

## 2017-07-20 NOTE — Telephone Encounter (Signed)
Rx printed, to PCP for signature. 

## 2017-07-20 NOTE — Telephone Encounter (Signed)
Okay for refill?  

## 2017-07-27 ENCOUNTER — Other Ambulatory Visit: Payer: Self-pay | Admitting: Internal Medicine

## 2017-07-28 ENCOUNTER — Telehealth: Payer: Self-pay | Admitting: *Deleted

## 2017-07-28 NOTE — Telephone Encounter (Signed)
Fax from pharmacy requesting clarificationfor patient's acyclovir.  Please advise frequency. Current sig says "take as directed."

## 2017-07-29 MED ORDER — ACYCLOVIR 200 MG PO CAPS: 400.0000 mg | ORAL_CAPSULE | Freq: Three times a day (TID) | ORAL | 0 refills | Status: AC | PRN

## 2017-07-29 NOTE — Telephone Encounter (Signed)
Acyclovir 200 mg 2 tablets 3 times daily for 7 days as needed for acute herpes

## 2017-07-29 NOTE — Telephone Encounter (Signed)
Medication filled to pharmacy as requested.   

## 2017-09-06 ENCOUNTER — Other Ambulatory Visit: Payer: Self-pay | Admitting: Internal Medicine

## 2017-10-17 ENCOUNTER — Other Ambulatory Visit: Payer: Self-pay | Admitting: Internal Medicine

## 2017-11-04 ENCOUNTER — Telehealth: Payer: Self-pay | Admitting: Internal Medicine

## 2017-11-04 NOTE — Telephone Encounter (Signed)
Copied from [place] [id number]. Topic: Quick Communication - Rx Refill/Question >> Nov 04, 2017  4:01 PM Cleaster Corin, [place] wrote: Medication: tadalafil (CIALIS) 20 MG tablet [[id number]]  Has the patient contacted their pharmacy? no (Agent: If no, request that the patient contact the pharmacy for the refill.) (Agent: If yes, when and what did the pharmacy advise?)  Preferred Pharmacy (with phone number or street name): [id number] [address], [place] [place] [address] Phone: [phone] Fax: [phone]    Agent: Please be advised that RX refills may take up to 3 business days. We ask that you follow-up with your pharmacy.

## 2017-11-05 MED ORDER — TADALAFIL 20 MG PO TABS: 20.0000 mg | ORAL_TABLET | Freq: Every day | ORAL | 4 refills | Status: DC | PRN

## 2017-11-05 NOTE — Telephone Encounter (Signed)
Medication refill request Cialis 20 mg  Last refill 12/07/2016  10 tabs with 4  Refills Dr Inda Merlin    Last OV  AD

## 2017-11-05 NOTE — Telephone Encounter (Signed)
REFILL REQUEST

## 2017-11-10 ENCOUNTER — Ambulatory Visit (INDEPENDENT_AMBULATORY_CARE_PROVIDER_SITE_OTHER): Payer: Medicare Other | Admitting: Internal Medicine

## 2017-11-10 ENCOUNTER — Encounter: Payer: Self-pay | Admitting: Internal Medicine

## 2017-11-10 VITALS — HR 65 | Temp 98.1°F | Ht 69.0 in | Wt 180.0 lb

## 2017-11-10 DIAGNOSIS — K219 Gastro-esophageal reflux disease without esophagitis: Secondary | ICD-10-CM | POA: Diagnosis not present

## 2017-11-10 DIAGNOSIS — Z23 Encounter for immunization: Secondary | ICD-10-CM | POA: Diagnosis not present

## 2017-11-10 DIAGNOSIS — Z87442 Personal history of urinary calculi: Secondary | ICD-10-CM | POA: Diagnosis not present

## 2017-11-10 DIAGNOSIS — Z0000 Encounter for general adult medical examination without abnormal findings: Secondary | ICD-10-CM | POA: Diagnosis not present

## 2017-11-10 LAB — LIPID PANEL
CHOL/HDL RATIO: 4
Cholesterol: 220 mg/dL — ABNORMAL HIGH (ref 0–200)
HDL: 62.5 mg/dL (ref >39.00)
LDL CALC: 142 mg/dL — AB (ref 0–99)
NONHDL: 157.05
TRIGLYCERIDES: 75 mg/dL (ref 0.0–149.0)
VLDL: 15 mg/dL (ref 0.0–40.0)

## 2017-11-10 LAB — CBC WITH DIFFERENTIAL/PLATELET
BASOS PCT: 0.9 % (ref 0.0–3.0)
Basophils Absolute: 0 10*3/uL (ref 0.0–0.1)
EOS PCT: 1.7 % (ref 0.0–5.0)
Eosinophils Absolute: 0.1 10*3/uL (ref 0.0–0.7)
HEMATOCRIT: 47 % (ref 39.0–52.0)
Hemoglobin: 16 g/dL (ref 13.0–17.0)
LYMPHS ABS: 2 10*3/uL (ref 0.7–4.0)
LYMPHS PCT: 40.9 % (ref 12.0–46.0)
MCHC: 34 g/dL (ref 30.0–36.0)
MCV: 92.1 fl (ref 78.0–100.0)
MONOS PCT: 8.7 % (ref 3.0–12.0)
Monocytes Absolute: 0.4 10*3/uL (ref 0.1–1.0)
NEUTROS ABS: 2.4 10*3/uL (ref 1.4–7.7)
NEUTROS PCT: 47.8 % (ref 43.0–77.0)
PLATELETS: 240 10*3/uL (ref 150.0–400.0)
RBC: 5.1 Mil/uL (ref 4.22–5.81)
RDW: 13.2 % (ref 11.5–15.5)
WBC: 5 10*3/uL (ref 4.0–10.5)

## 2017-11-10 LAB — TSH: TSH: 2.24 u[IU]/mL (ref 0.35–4.50)

## 2017-11-10 LAB — PSA: PSA: 0.94 ng/mL (ref 0.10–4.00)

## 2017-11-10 LAB — COMPREHENSIVE METABOLIC PANEL
ALT: 12 U/L (ref 0–53)
AST: 18 U/L (ref 0–37)
Albumin: 4.5 g/dL (ref 3.5–5.2)
Alkaline Phosphatase: 51 U/L (ref 39–117)
BUN: 25 mg/dL — ABNORMAL HIGH (ref 6–23)
CALCIUM: 9.8 mg/dL (ref 8.4–10.5)
CHLORIDE: 107 meq/L (ref 96–112)
CO2: 27 mEq/L (ref 19–32)
Creatinine, Ser: 0.92 mg/dL (ref 0.40–1.50)
GFR: 87.4 mL/min (ref >60.00)
GLUCOSE: 106 mg/dL — AB (ref 70–99)
Potassium: 4.4 mEq/L (ref 3.5–5.1)
Sodium: 142 mEq/L (ref 135–145)
Total Bilirubin: 0.7 mg/dL (ref 0.2–1.2)
Total Protein: 6.7 g/dL (ref 6.0–8.3)

## 2017-11-10 MED ORDER — TADALAFIL 20 MG PO TABS: 20.0000 mg | ORAL_TABLET | Freq: Every day | ORAL | 4 refills | Status: DC | PRN

## 2017-11-10 MED ORDER — OMEPRAZOLE 20 MG PO CPDR: DELAYED_RELEASE_CAPSULE | ORAL | 3 refills | Status: DC

## 2017-11-10 MED ORDER — LORAZEPAM 0.5 MG PO TABS: 0.5000 mg | ORAL_TABLET | Freq: Every day | ORAL | 1 refills | Status: DC

## 2017-11-10 NOTE — Progress Notes (Signed)
Subjective:    Patient ID: Robert Cowan, male    DOB: Mar 17, 1951, 67 y.o.   MRN: [id number]  HPI  67 year old patient who is seen today for a preventive health examination He enjoys excellent health.  Does have a history of nephrolithiasis.  Mother died of colon cancer at 73 last colonoscopy was 12 months ago. He has a history of gastroesophageal reflux disease. No concerns or complaints today  Family history.  Mother died at 66 of complications of colon cancer. Father history depression and died a suicide death.  2 brothers are well Social history lifelong non-smoker Retired about 1 year  Past Medical History:  Diagnosis Date  . DIVERTICULOSIS, COLON 02/02/2007  . GERD 01/28/2007  . Insomnia   . KIDNEY STONE 04/19/2008  . Melanoma ([place]) 2005  . 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 RENAL CALCULUS, LEFT 04/19/2008  . 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 Marital status: Single    Spouse name: Not on file  . Number of children: Not on file  . Years of education: Not on file  . Highest education level: Not on file  Occupational History  . Not on file  Social Needs  . Financial resource strain: Not on file  . Food insecurity:    Worry: Not on file    Inability: Not on file  . Transportation needs:    Medical: Not on file    Non-medical: Not on file  Tobacco Use  . Smoking status: Never Smoker  . Smokeless tobacco: Never Used  Substance and Sexual Activity  . Alcohol use: Yes    Alcohol/week: 1.8 oz    Types: 3 Cans of beer per week    Comment: mixture of beer, wine, liquor  . Drug use: No  . Sexual activity: Not on file  Lifestyle  . Physical activity:    Days per week: Not on file    Minutes per session: Not on file  . Stress: Not on file  Relationships  . Social connections:    Talks on phone: Not on file    Gets together: Not on file    Attends religious service: Not on file    Active member of club or organization:  Not on file    Attends meetings of clubs or organizations: Not on file    Relationship status: Not on file  . Intimate partner violence:    Fear of current or ex partner: Not on file    Emotionally abused: Not on file    Physically abused: Not on file    Forced sexual activity: Not on file  Other Topics Concern  . Not on file  Social History Narrative   Single   Gets regular exercise    Past Surgical History:  Procedure Laterality Date  . COLONOSCOPY    . MELANOMA EXCISION     left cheek/face  . NOSE SURGERY     Deviated Septum    Family History  Problem Relation Age of Onset  . Colon cancer Mother     Allergies  Allergen Reactions  . Tetracycline Hcl     REACTION: blister on genitals    Current Outpatient Medications on File Prior to Visit  Medication Sig Dispense Refill  . acyclovir (ZOVIRAX) 200 MG capsule TAKE AS DIRECTED 90 capsule 0  . aspirin 81 MG tablet Take 81 mg by mouth daily.      . diphenhydramine-acetaminophen (TYLENOL PM) 25-500 MG TABS Take  1 tablet by mouth at bedtime as needed. sleep     . LORazepam (ATIVAN) 0.5 MG tablet Take 1 tablet (0.5 mg total) by mouth at bedtime. 60 tablet 1  . Multiple Vitamin (MULTI-DAY VITAMINS PO) Take by mouth as needed.     Marland Kitchen omeprazole (PRILOSEC) 20 MG capsule TAKE 1 CAPSULE (20 MG TOTAL) BY MOUTH DAILY. 90 capsule 3  . OVER THE COUNTER MEDICATION Co q 10 120mg  daily    . OVER THE COUNTER MEDICATION Magnesium    . tadalafil (CIALIS) 20 MG tablet Take 1 tablet (20 mg total) by mouth daily as needed. 10 tablet 4  . vitamin E (VITAMIN E) 400 UNIT capsule Take 400 Units by mouth daily.     No current facility-administered medications on file prior to visit.     Pulse 65   Temp 98.1 F (36.7 C) (Oral)   Ht 5\' 9"  (1.753 m)   Wt 180 lb (81.6 kg)   SpO2 96%   BMI 26.58 kg/m   Subsequent Medicare wellness visit  1. Risk factors, based on past  M,S,F history.  No cardiovascular risk factors.  Does have a history of  mild dyslipidemia  2.  Physical activities: Remains quite active.  More recently has been very active with pickleball  3.  Depression/mood: No major depression or mood disorder does take alprazolam at bedtime to assist with sleep  4.  Hearing: No deficits  5.  ADL's: Independent  6.  Fall risk: Low   7.  Home safety: No problems identified  8.  Height weight, and visual acuity; height and weight stable no change in visual acuity as a Extraction surgery within the past year and is quite pleased with his vision  9.  Counseling: Continue heart healthy diet and active lifestyle  10. Lab orders based on risk factors: Laboratory update will be reviewed  11. Referral : None appropriate at this time  12. Care plan:  Continue biannual dermatology follow-up due to history of melanoma 13. Cognitive assessment: Alert and appropriate normal affect no cognitive dysfunction  14. Screening: Patient provided with a written and personalized 5-10 year screening schedule in the AVS.    15. Provider List Update:  Dermatology primary care gastroenterology   Review of Systems  Constitutional: Negative for appetite change, chills, fatigue and fever.  HENT: Negative for congestion, dental problem, ear pain, hearing loss, sore throat, tinnitus, trouble swallowing and voice change.   Eyes: Negative for pain, discharge and visual disturbance.  Respiratory: Negative for cough, chest tightness, wheezing and stridor.   Cardiovascular: Negative for chest pain, palpitations and leg swelling.  Gastrointestinal: Negative for abdominal distention, abdominal pain, blood in stool, constipation, diarrhea, nausea and vomiting.  Genitourinary: Negative for difficulty urinating, discharge, flank pain, genital sores, hematuria and urgency.  Musculoskeletal: Negative for arthralgias, back pain, gait problem, joint swelling, myalgias and neck stiffness.  Skin: Negative for rash.  Neurological: Negative for dizziness,  syncope, speech difficulty, weakness, numbness and headaches.  Hematological: Negative for adenopathy. Does not bruise/bleed easily.  Psychiatric/Behavioral: Negative for behavioral problems and dysphoric mood. The patient is not nervous/anxious.        Objective:   Physical Exam  Constitutional: He is oriented to person, place, and time. He appears well-developed.  HENT:  Head: Normocephalic.  Right Ear: External ear normal.  Left Ear: External ear normal.  Eyes: Conjunctivae and EOM are normal.  Neck: Normal range of motion.  Cardiovascular: Normal rate, normal heart sounds and intact  distal pulses.  Pulmonary/Chest: Breath sounds normal.  Abdominal: Bowel sounds are normal.  Genitourinary: Rectal exam shows guaiac negative stool.  Musculoskeletal: Normal range of motion. He exhibits no edema or tenderness.  Neurological: He is alert and oriented to person, place, and time.  Psychiatric: He has a normal mood and affect. His behavior is normal.          Assessment & Plan:  Preventive health examination  Subsequent Medicare wellness visit History nephrolithiasis stable Family history of colon cancer.  Continue colonoscopies every 5 years ED.  Cialis refilled History Gastrosoft reflux disease  Follow-up 1 year or as needed  [employer]

## 2017-11-10 NOTE — Patient Instructions (Signed)

## 2017-11-10 NOTE — Addendum Note (Signed)
Addended by: Gwenyth Ober R on: 11/10/2017 11:00 AM   Modules accepted: Orders

## 2017-11-11 LAB — HEPATITIS C ANTIBODY
HEP C AB: NONREACTIVE
SIGNAL TO CUT-OFF: 0.03 (ref <1.00)

## 2017-11-24 ENCOUNTER — Ambulatory Visit: Payer: Medicare Other | Admitting: Family Medicine

## 2017-11-24 DIAGNOSIS — M25561 Pain in right knee: Secondary | ICD-10-CM | POA: Diagnosis not present

## 2017-11-24 MED ORDER — METHYLPREDNISOLONE ACETATE 40 MG/ML IJ SUSP
40.0000 mg | Freq: Once | INTRAMUSCULAR | Status: AC
  Administered 2017-11-24: 40 mg via INTRA_ARTICULAR

## 2017-11-24 NOTE — Patient Instructions (Signed)
Your pain is due to a flare of arthritis vs a degenerative medial meniscus tear. These are the different medications you can take for this: Tylenol 500mg  1-2 tabs three times a day for pain. Capsaicin, aspercreme, or salon pas topically up to four times a day may also help with pain. Some supplements that may help for arthritis: Boswellia extract, curcumin, pycnogenol Aleve 1-2 tabs twice a day with food for pain and inflammation - take only as needed though. Cortisone injections are an option - you were given this today. I would avoid squats, lunges, leg press, twisting motions/activities for the next month It's important that you continue to stay active. Straight leg raises, knee extensions 3 sets of 10 once a day (add ankle weight if these become too easy). Consider physical therapy to strengthen muscles around the joint that hurts to take pressure off of the joint itself. Shoe inserts with good arch support may be helpful. Ice 15 minutes at a time 3-4 times a day as needed to help with pain. Compression sleeve may help with swelling - a brace is not necessary. Water aerobics and cycling with low resistance are the best two types of exercise for arthritis though any exercise is ok as long as it doesn't worsen the pain. Follow up with me in 1 month for reevaluation.

## 2017-11-25 ENCOUNTER — Encounter: Payer: Self-pay | Admitting: Family Medicine

## 2017-11-25 DIAGNOSIS — M25561 Pain in right knee: Secondary | ICD-10-CM | POA: Insufficient documentation

## 2017-11-25 NOTE — Progress Notes (Addendum)
PCP: Marletta Lor, MD  Subjective:   HPI: Patient is a 67 y.o. male here for right knee pain.  Patient reports for about 5-6 weeks he's had medial right knee pain. No acute injury though had a slight twinge in this area when playing pickleball that didn't hurt and didn't limit his ability to play. More pain last week when helping do some work in [employer] No prior issues. Had warmth to this area, maybe some swelling. Tried sleeve, icy hot and no change with these. Pain is a mild soreness. No skin changes, numbness.  Past Medical History:  Diagnosis Date  . DIVERTICULOSIS, COLON 02/02/2007  . GERD 01/28/2007  . Insomnia   . KIDNEY STONE 04/19/2008  . Melanoma ([place]) 2005  . 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 RENAL CALCULUS, LEFT 04/19/2008  . 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 acyclovir (ZOVIRAX) 200 MG capsule TAKE AS DIRECTED 90 capsule 0  . aspirin 81 MG tablet Take 81 mg by mouth daily.      . diphenhydramine-acetaminophen (TYLENOL PM) 25-500 MG TABS Take 1 tablet by mouth at bedtime as needed. sleep     . LORazepam (ATIVAN) 0.5 MG tablet Take 1 tablet (0.5 mg total) by mouth at bedtime. 60 tablet 1  . Multiple Vitamin (MULTI-DAY VITAMINS PO) Take by mouth as needed.     Marland Kitchen omeprazole (PRILOSEC) 20 MG capsule TAKE 1 CAPSULE (20 MG TOTAL) BY MOUTH DAILY. 90 capsule 3  . OVER THE COUNTER MEDICATION Co q 10 120mg  daily    . OVER THE COUNTER MEDICATION Magnesium    . tadalafil (CIALIS) 20 MG tablet Take 1 tablet (20 mg total) by mouth daily as needed. 90 tablet 4  . vitamin E (VITAMIN E) 400 UNIT capsule Take 400 Units by mouth daily.     No current facility-administered medications on file prior to visit.     Past Surgical History:  Procedure Laterality Date  . COLONOSCOPY    . MELANOMA EXCISION     left cheek/face  . NOSE SURGERY     Deviated Septum    Allergies   Allergen Reactions  . Tetracycline Hcl     REACTION: blister on genitals    Social History   Socioeconomic History  . Marital status: Single    Spouse name: Not on file  . Number of children: Not on file  . Years of education: Not on file  . Highest education level: Not on file  Occupational History  . Not on file  Social Needs  . Financial resource strain: Not on file  . Food insecurity:    Worry: Not on file    Inability: Not on file  . Transportation needs:    Medical: Not on file    Non-medical: Not on file  Tobacco Use  . Smoking status: Never Smoker  . Smokeless tobacco: Never Used  Substance and Sexual Activity  . Alcohol use: Yes    Alcohol/week: 1.8 oz    Types: 3 Cans of beer per week    Comment: mixture of beer, wine, liquor  . Drug use: No  . Sexual activity: Not on file  Lifestyle  . Physical activity:    Days per week: Not on file    Minutes per session: Not on file  . Stress: Not on file  Relationships  . Social connections:    Talks on phone: Not on file  Gets together: Not on file    Attends religious service: Not on file    Active member of club or organization: Not on file    Attends meetings of clubs or organizations: Not on file    Relationship status: Not on file  . Intimate partner violence:    Fear of current or ex partner: Not on file    Emotionally abused: Not on file    Physically abused: Not on file    Forced sexual activity: Not on file  Other Topics Concern  . Not on file  Social History Narrative   Single   Gets regular exercise    Family History  Problem Relation Age of Onset  . Colon cancer Mother     BP (!) 150/90   Ht 5\' 9"  (1.753 m)   Wt 179 lb (81.2 kg)   BMI 26.43 kg/m   Review of Systems: See HPI above.     Objective:  Physical Exam:  Gen: NAD, comfortable in exam room  Right knee: Minimal effusion.  No other gross deformity, ecchymoses. Mild TTP medial joint line.  No other tenderness. FROM  with 5/5 strength. Negative ant/post drawers. Negative valgus/varus testing. Negative lachmanns. Negative mcmurrays, apleys, patellar apprehension.  Negative bounce.  Equivocal thessalys. NV intact distally.  Left knee: No deformity. FROM with 5/5 strength. No tenderness to palpation. NVI distally.   Assessment & Plan:  1. 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  Discussed tylenol, topical medications, supplements that may help, aleve.  Opted for cortisone injection which was given today.  Shown home exercises.  F/u in 1 month.  After informed written consent timeout was performed, patient was lying supine on exam table. Right knee was prepped with alcohol swab and utilizing superolateral approach with ultrasound guidance, patient's right knee was injected intraarticularly with 3:1 marcaine: depomedrol. Patient tolerated the procedure well without immediate complications.

## 2017-11-25 NOTE — Assessment & Plan Note (Addendum)
consistent with flare of medial arthritis vs degenerative medial meniscus tear.  Discussed tylenol, topical medications, supplements that may help, aleve.  Opted for cortisone injection which was given today.  Shown home exercises.  F/u in 1 month.  After informed written consent timeout was performed, patient was lying supine on exam table. Right knee was prepped with alcohol swab and utilizing superolateral approach with ultrasound guidance, patient's right knee was injected intraarticularly with 3:1 marcaine: depomedrol. Patient tolerated the procedure well without immediate complications.

## 2017-12-05 ENCOUNTER — Other Ambulatory Visit: Payer: Self-pay | Admitting: Internal Medicine

## 2017-12-22 ENCOUNTER — Ambulatory Visit: Payer: Medicare Other | Admitting: Family Medicine

## 2018-02-09 ENCOUNTER — Other Ambulatory Visit: Payer: Self-pay | Admitting: Internal Medicine

## 2018-02-09 NOTE — Telephone Encounter (Signed)
Lorazepam (Ativan) 0.5mg  tab refill.Pt requesting to have early refill due to vacation Last Refill:11/10/17 #60 with 1 refill Last OV: 11/10/17 PCP: Dr. Burnice Logan [facility] [address]

## 2018-02-09 NOTE — Telephone Encounter (Signed)
Copied from [place] [id number]. Topic: Quick Communication - Rx Refill/Question >> Feb 09, 2018 12:36 PM Synthia Innocent wrote: Medication: LORazepam (ATIVAN) 0.5 MG tablet, need early refill due to vacation  Has the patient contacted their pharmacy? Yes.   (Agent: If no, request that the patient contact the pharmacy for the refill.) (Agent: If yes, when and what did the pharmacy advise?)  Preferred Pharmacy (with phone number or street name): [facility]  Agent: Please be advised that RX refills may take up to 3 business days. We ask that you follow-up with your pharmacy.

## 2018-02-14 MED ORDER — LORAZEPAM 0.5 MG PO TABS: 0.5000 mg | ORAL_TABLET | Freq: Every day | ORAL | 1 refills | Status: DC

## 2018-07-18 ENCOUNTER — Encounter: Payer: Self-pay | Admitting: Family Medicine

## 2018-07-18 ENCOUNTER — Ambulatory Visit: Payer: Medicare Other | Admitting: Family Medicine

## 2018-07-18 VITALS — BP 122/76 | HR 93 | Temp 98.0°F | Resp 12 | Ht 69.0 in | Wt 174.4 lb

## 2018-07-18 DIAGNOSIS — K219 Gastro-esophageal reflux disease without esophagitis: Secondary | ICD-10-CM | POA: Diagnosis not present

## 2018-07-18 DIAGNOSIS — G4700 Insomnia, unspecified: Secondary | ICD-10-CM | POA: Diagnosis not present

## 2018-07-18 DIAGNOSIS — E785 Hyperlipidemia, unspecified: Secondary | ICD-10-CM

## 2018-07-18 MED ORDER — LORAZEPAM 0.5 MG PO TABS: 0.5000 mg | ORAL_TABLET | Freq: Every day | ORAL | 2 refills | Status: DC

## 2018-07-18 NOTE — Assessment & Plan Note (Signed)
Continue omeprazole 20 mg daily as needed. GERD precautions to continue.

## 2018-07-18 NOTE — Assessment & Plan Note (Signed)
He will continue on nonpharmacologic treatment. We have we will plan on checking fasting lipid panel next visit.

## 2018-07-18 NOTE — Patient Instructions (Addendum)
A few things to remember from today's visit:   Gastroesophageal reflux disease without esophagitis  Hyperlipidemia, unspecified hyperlipidemia type  Insomnia, unspecified type - Plan: LORazepam (ATIVAN) 0.5 MG tablet  No changes today on any other medications. Please advise appointment for your next physical.

## 2018-07-18 NOTE — Assessment & Plan Note (Signed)
Problem is well controlled. No changes in lorazepam 0.5 mg at bedtime. Good sleep hygiene is also recommended.

## 2018-07-18 NOTE — Progress Notes (Signed)
HPI:   Robert Cowan is a 68 y.o. male, who is here today to establish care.  Former PCP: Dr. Burnice Logan Last preventive routine visit: 11/10/17  Chronic medical problems: Insomnia due to anxiety,GERD,ED (on Cialis),HLD among some.  HLD: He is on non pharmacologic treatment. No Hx of CAD Lab Results  Component Value Date   CHOL 220 (H) 11/10/2017   HDL 62.50 11/10/2017   LDLCALC 142 (H) 11/10/2017   LDLDIRECT 134.0 03/18/2012   TRIG 75.0 11/10/2017   CHOLHDL 4 11/10/2017   Insomnia,he is on Ativan 0.5 mg 1 tab at night prn. She has been on medication for many years. He is not sure about exacerbating factors,he thinks it may be "ADHD" re;ated. Denies depressed mood or suicidal thoughts. No side effects reported.  GERD: He is on Omeprazole 20 mg daily prn. Symptoms exacerbated by spicy foods.  Denies abdominal pain, nausea, vomiting, changes in bowel habits, blood in stool or melena.   Review of Systems  Constitutional: Negative for activity change, appetite change, fatigue, fever and unexpected weight change.  HENT: Negative for nosebleeds, sore throat and trouble swallowing.   Eyes: Negative for redness and visual disturbance.  Respiratory: Negative for cough, shortness of breath and wheezing.   Cardiovascular: Negative for chest pain, palpitations and leg swelling.  Gastrointestinal: Negative for abdominal pain, nausea and vomiting.  Genitourinary: Negative for decreased urine volume, dysuria and hematuria.  Musculoskeletal: Positive for arthralgias.  Neurological: Negative for weakness and headaches.  Psychiatric/Behavioral: The patient is nervous/anxious.     Current Outpatient Medications on File Prior to Visit  Medication Sig Dispense Refill  . acyclovir (ZOVIRAX) 200 MG capsule TAKE AS DIRECTED 90 capsule 0  . aspirin 81 MG tablet Take 81 mg by mouth daily.      . diphenhydramine-acetaminophen (TYLENOL PM) 25-500 MG TABS Take 1 tablet by mouth at  bedtime as needed. sleep     . Multiple Vitamin (MULTI-DAY VITAMINS PO) Take by mouth as needed.     Marland Kitchen omeprazole (PRILOSEC) 20 MG capsule TAKE 1 CAPSULE (20 MG TOTAL) BY MOUTH DAILY. 90 capsule 3  . OVER THE COUNTER MEDICATION Co q 10 120mg  daily    . OVER THE COUNTER MEDICATION Magnesium    . tadalafil (CIALIS) 20 MG tablet Take 1 tablet (20 mg total) by mouth daily as needed. 90 tablet 4  . vitamin E (VITAMIN E) 400 UNIT capsule Take 400 Units by mouth daily.     No current facility-administered medications on file prior to visit.      Past Medical History:  Diagnosis Date  . DIVERTICULOSIS, COLON 02/02/2007  . GERD 01/28/2007  . Insomnia   . KIDNEY STONE 04/19/2008  . Melanoma ([place]) 2005  . 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 RENAL CALCULUS, LEFT 04/19/2008  . 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 Tetracycline Hcl     REACTION: blister on genitals    Family History  Problem Relation Age of Onset  . Colon cancer Mother     Social History   Socioeconomic History  . Marital status: Single    Spouse name: Not on file  . Number of children: Not on file  . Years of education: Not on file  . Highest education level: Not on file  Occupational History  . Not on file  Social Needs  . Financial resource strain: Not on file  . Food insecurity:    Worry: Not on file  Inability: Not on file  . Transportation needs:    Medical: Not on file    Non-medical: Not on file  Tobacco Use  . Smoking status: Never Smoker  . Smokeless tobacco: Never Used  Substance and Sexual Activity  . Alcohol use: Yes    Alcohol/week: 3.0 standard drinks    Types: 3 Cans of beer per week    Comment: mixture of beer, wine, liquor  . Drug use: No  . Sexual activity: Not on file  Lifestyle  . Physical activity:    Days per week: Not on file    Minutes per session: Not on file  . Stress: Not on file  Relationships  . Social connections:    Talks on phone: Not on  file    Gets together: Not on file    Attends religious service: Not on file    Active member of club or organization: Not on file    Attends meetings of clubs or organizations: Not on file    Relationship status: Not on file  Other Topics Concern  . Not on file  Social History Narrative   Single   Gets regular exercise    Vitals:   07/18/18 1543  BP: 122/76  Pulse: 93  Resp: 12  Temp: 98 F (36.7 C)  SpO2: 96%    Body mass index is 25.75 kg/m.   Physical Exam  Nursing note reviewed. Constitutional: He is oriented to person, place, and time. He appears well-developed and well-nourished. No distress.  HENT:  Head: Normocephalic and atraumatic.  Mouth/Throat: Oropharynx is clear and moist and mucous membranes are normal.  Eyes: Pupils are equal, round, and reactive to light. Conjunctivae are normal.  Cardiovascular: Normal rate and regular rhythm.  No murmur heard. Pulses:      Dorsalis pedis pulses are 2+ on the right side and 2+ on the left side.  Respiratory: Effort normal and breath sounds normal. No respiratory distress.  GI: Soft. He exhibits no mass. There is no hepatomegaly. There is no abdominal tenderness.  Musculoskeletal:        General: No edema.  Lymphadenopathy:    He has no cervical adenopathy.  Neurological: He is alert and oriented to person, place, and time. He has normal strength. No cranial nerve deficit. Gait normal.  Skin: Skin is warm. No rash noted. No erythema.  Psychiatric: He has a normal mood and affect.  Well groomed, good eye contact.      ASSESSMENT AND PLAN:   Robert Cowan was seen today for transfer of care.  Diagnoses and all orders for this visit:   Insomnia Problem is well controlled. No changes in lorazepam 0.5 mg at bedtime. Good sleep hygiene is also recommended.   GERD Continue omeprazole 20 mg daily as needed. GERD precautions to continue.  Hyperlipidemia He will continue on nonpharmacologic treatment. We  have we will plan on checking fasting lipid panel next visit.      Return in about 5 months (around 12/16/2018) for cpe.     Gagandeep Kossman G. [place], MD  [facility]. [place] office.

## 2018-09-27 ENCOUNTER — Encounter: Payer: Self-pay | Admitting: Family Medicine

## 2018-09-27 ENCOUNTER — Other Ambulatory Visit: Payer: Self-pay

## 2018-09-27 ENCOUNTER — Ambulatory Visit: Payer: Medicare Other | Admitting: Family Medicine

## 2018-09-27 VITALS — BP 138/78 | HR 70 | Temp 98.0°F | Ht 69.0 in | Wt 178.0 lb

## 2018-09-27 DIAGNOSIS — M25561 Pain in right knee: Secondary | ICD-10-CM

## 2018-09-27 MED ORDER — METHYLPREDNISOLONE ACETATE 40 MG/ML IJ SUSP
40.0000 mg | Freq: Once | INTRAMUSCULAR | Status: AC
  Administered 2018-09-27: 40 mg via INTRA_ARTICULAR

## 2018-09-27 NOTE — Patient Instructions (Signed)
Your pain is due to a flare of arthritis. These are the different medications you can take for this: Tylenol 500mg  1-2 tabs three times a day for pain. Capsaicin, aspercreme, or salon pas topically up to four times a day may also help with pain. Some supplements that may help for arthritis: Boswellia extract, curcumin, pycnogenol Aleve 1-2 tabs twice a day with food for pain and inflammation - take only as needed though. Cortisone injections are an option - you were given this today. I would avoid squats, lunges, leg press, twisting motions/activities for the next month It's important that you continue to stay active. Straight leg raises, knee extensions 3 sets of 10 once a day (add ankle weight if these become too easy). Consider physical therapy to strengthen muscles around the joint that hurts to take pressure off of the joint itself. Shoe inserts with good arch support may be helpful. Ice 15 minutes at a time 3-4 times a day as needed to help with pain. Compression sleeve may help with swelling. Water aerobics and cycling with low resistance are the best two types of exercise for arthritis though any exercise is ok as long as it doesn't worsen the pain. Follow up with me in 1 month or as needed if you're doing well.

## 2018-09-27 NOTE — Progress Notes (Signed)
PCP: Martinique, Betty G, MD  Subjective:   HPI: Patient is a 68 y.o. male here for right knee pain.  11/24/17: Patient reports for about 5-6 weeks he's had medial right knee pain. No acute injury though had a slight twinge in this area when playing pickleball that didn't hurt and didn't limit his ability to play. More pain last week when helping do some work in [employer] No prior issues. Had warmth to this area, maybe some swelling. Tried sleeve, icy hot and no change with these. Pain is a mild soreness. No skin changes, numbness.  09/27/18: Patient reports he's done well until about 2 weeks ago. No acute injury or trauma. But was in yard around that time when started to notice more pain medial right knee. Pain has improved some down to 3/10 level, more dull. Was very swollen last Thursday and couldn't straighten the knee. Tried lidocaine patch, CBD oil, rest. No skin changes, numbness.  Past Medical History:  Diagnosis Date  . DIVERTICULOSIS, COLON 02/02/2007  . GERD 01/28/2007  . Insomnia   . KIDNEY STONE 04/19/2008  . Melanoma ([place]) 2005  . 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 RENAL CALCULUS, LEFT 04/19/2008  . 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 acyclovir (ZOVIRAX) 200 MG capsule TAKE AS DIRECTED 90 capsule 0  . aspirin 81 MG tablet Take 81 mg by mouth daily.      . diphenhydramine-acetaminophen (TYLENOL PM) 25-500 MG TABS Take 1 tablet by mouth at bedtime as needed. sleep     . LORazepam (ATIVAN) 0.5 MG tablet Take 1 tablet (0.5 mg total) by mouth at bedtime. 30 tablet 2  . Multiple Vitamin (MULTI-DAY VITAMINS PO) Take by mouth as needed.     Marland Kitchen omeprazole (PRILOSEC) 20 MG capsule TAKE 1 CAPSULE (20 MG TOTAL) BY MOUTH DAILY. 90 capsule 3  . OVER THE COUNTER MEDICATION Co q 10 120mg  daily    . OVER THE COUNTER MEDICATION Magnesium    . tadalafil (CIALIS) 20 MG tablet Take 1 tablet (20 mg  total) by mouth daily as needed. 90 tablet 4  . vitamin E (VITAMIN E) 400 UNIT capsule Take 400 Units by mouth daily.     No current facility-administered medications on file prior to visit.     Past Surgical History:  Procedure Laterality Date  . COLONOSCOPY    . MELANOMA EXCISION     left cheek/face  . NOSE SURGERY     Deviated Septum    Allergies  Allergen Reactions  . Tetracycline Hcl     REACTION: blister on genitals    Social History   Socioeconomic History  . Marital status: Single    Spouse name: Not on file  . Number of children: Not on file  . Years of education: Not on file  . Highest education level: Not on file  Occupational History  . Not on file  Social Needs  . Financial resource strain: Not on file  . Food insecurity:    Worry: Not on file    Inability: Not on file  . Transportation needs:    Medical: Not on file    Non-medical: Not on file  Tobacco Use  . Smoking status: Never Smoker  . Smokeless tobacco: Never Used  Substance and Sexual Activity  . Alcohol use: Yes    Alcohol/week: 3.0 standard drinks    Types: 3 Cans of beer per week  Comment: mixture of beer, wine, liquor  . Drug use: No  . Sexual activity: Not on file  Lifestyle  . Physical activity:    Days per week: Not on file    Minutes per session: Not on file  . Stress: Not on file  Relationships  . Social connections:    Talks on phone: Not on file    Gets together: Not on file    Attends religious service: Not on file    Active member of club or organization: Not on file    Attends meetings of clubs or organizations: Not on file    Relationship status: Not on file  . Intimate partner violence:    Fear of current or ex partner: Not on file    Emotionally abused: Not on file    Physically abused: Not on file    Forced sexual activity: Not on file  Other Topics Concern  . Not on file  Social History Narrative   Single   Gets regular exercise    Family History   Problem Relation Age of Onset  . Colon cancer Mother     BP 138/78   Pulse 70   Temp 98 F (36.7 C) (Oral)   Ht 5\' 9"  (1.753 m)   Wt 178 lb (80.7 kg)   BMI 26.29 kg/m   Review of Systems: See HPI above.     Objective:  Physical Exam:  Gen: NAD, comfortable in exam room  Right knee: Minimal effusion.  No other gross deformity, ecchymoses. TTP medial joint line.  No other tenderness. FROM with 5/5 strength flexion and extension. Negative ant/post drawers. Negative valgus/varus testing. Negative lachmans. Negative mcmurrays, apleys, patellar apprehension. NV intact distally.  Left knee: No deformity. FROM with 5/5 strength. No tenderness to palpation. NVI distally.   Assessment & Plan:  1. Right knee pain - consistent with flare of medial arthritis.  Repeated intraarticular steroid injection today.  Discussed tylenol, topical medications, supplements, home exercises.  F/u in 1 month or prn.  After informed written consent timeout was performed, patient was seated on exam table. Right knee was prepped with alcohol swab and utilizing anteromedial approach, patient's right knee was injected intraarticularly with 3:1 lidocaine: depomedrol. Patient tolerated the procedure well without immediate complications.

## 2018-10-14 ENCOUNTER — Telehealth: Payer: Self-pay | Admitting: *Deleted

## 2018-10-14 NOTE — Telephone Encounter (Signed)
Rx request for Lorazepam 0.5 mg #60, 1 Next ov: 11/28/2018, last ov: 07/18/2018

## 2018-10-17 ENCOUNTER — Other Ambulatory Visit: Payer: Self-pay | Admitting: Family Medicine

## 2018-10-17 DIAGNOSIS — G4700 Insomnia, unspecified: Secondary | ICD-10-CM

## 2018-10-17 MED ORDER — LORAZEPAM 0.5 MG PO TABS: 0.5000 mg | ORAL_TABLET | Freq: Every day | ORAL | 1 refills | Status: DC

## 2018-10-17 NOTE — Telephone Encounter (Signed)
Rx for Lorazepam 0.5 mg to continue a tab at bedtime as needed was sent. Thanks, BJ

## 2018-11-28 ENCOUNTER — Ambulatory Visit (INDEPENDENT_AMBULATORY_CARE_PROVIDER_SITE_OTHER): Payer: Medicare Other | Admitting: Family Medicine

## 2018-11-28 ENCOUNTER — Encounter: Payer: Self-pay | Admitting: Family Medicine

## 2018-11-28 ENCOUNTER — Other Ambulatory Visit: Payer: Self-pay

## 2018-11-28 VITALS — BP 140/80 | HR 71 | Temp 97.8°F | Resp 12 | Ht 69.0 in | Wt 172.8 lb

## 2018-11-28 DIAGNOSIS — Z125 Encounter for screening for malignant neoplasm of prostate: Secondary | ICD-10-CM | POA: Diagnosis not present

## 2018-11-28 DIAGNOSIS — Z0000 Encounter for general adult medical examination without abnormal findings: Secondary | ICD-10-CM

## 2018-11-28 DIAGNOSIS — R739 Hyperglycemia, unspecified: Secondary | ICD-10-CM

## 2018-11-28 DIAGNOSIS — E785 Hyperlipidemia, unspecified: Secondary | ICD-10-CM | POA: Diagnosis not present

## 2018-11-28 DIAGNOSIS — G4700 Insomnia, unspecified: Secondary | ICD-10-CM

## 2018-11-28 DIAGNOSIS — N529 Male erectile dysfunction, unspecified: Secondary | ICD-10-CM

## 2018-11-28 LAB — HEMOGLOBIN A1C: Hgb A1c MFr Bld: 5.7 % (ref 4.6–6.5)

## 2018-11-28 LAB — PSA: PSA: 1.1 ng/mL (ref 0.10–4.00)

## 2018-11-28 MED ORDER — TADALAFIL 20 MG PO TABS: 20.0000 mg | ORAL_TABLET | Freq: Every day | ORAL | 2 refills | Status: DC | PRN

## 2018-11-28 NOTE — Progress Notes (Signed)
HPI:  [name] is a 68 y.o.male here today for his routine physical examination.  Last CPE: 11/10/2017. He lives with his wife.  Regular exercise 3 or more times per week: He hikes and does yard work several times per week. Following a healthy diet: Yes.  Chronic medical problems: Nephrolithiasis, history of melanoma, GERD, hyperlipidemia, and insomnia among some.   Immunization History  Administered Date(s) Administered   Influenza Split 03/08/2012   Influenza, High Dose Seasonal PF 04/21/2017   Pneumococcal Conjugate-13 11/10/2017   Td 02/06/1998, 12/28/2008   Zoster 10/05/2015    -Hep C screening: 11/10/2017, NR Last colon cancer screening: 11/15/2015. Last prostate ca screening: 11/2017. Nocturia 1-2 in average. Lab Results  Component Value Date   PSA 0.94 11/10/2017   PSA 1.23 01/21/2016   PSA 0.74 11/09/2014   -Denies high alcohol intake, tobacco use, or Hx of illicit drug use.  He has no concerns today.  Last Medicare preventive visit on 11/10/2017.  Independent ADL's and IADL's. No falls in the past year and denies depression symptoms.  Functional Status Survey: Is the patient deaf or have difficulty hearing?: No Does the patient have difficulty seeing, even when wearing glasses/contacts?: No Does the patient have difficulty concentrating, remembering, or making decisions?: No Does the patient have difficulty walking or climbing stairs?: No Does the patient have difficulty dressing or bathing?: No Does the patient have difficulty doing errands alone such as visiting a doctor's office or shopping?: No  Fall Risk  11/28/2018 11/10/2017 11/03/2016 09/22/2013  Falls in the past year? 0 No No No  Number falls in past yr: 0 - - -  Injury with Fall? 0 - - -  Follow up Education provided - - -     Providers he sees regularly:  Eye care provider: N/A  Depression screen [facility] 2/9 11/28/2018  Decreased Interest 0  Down, Depressed, Hopeless 0  PHQ - 2  Score 0   Mini-Cog - 11/28/18 1036    Normal clock drawing test?  yes    How many words correct?  3        Hearing Screening   125Hz  250Hz  500Hz  1000Hz  2000Hz  3000Hz  4000Hz  6000Hz  8000Hz   Right ear:           Left ear:             Visual Acuity Screening   Right eye Left eye Both eyes  Without correction: 20/20 20/30 20/20   With correction:       Hyperlipidemia, currently he is on nonpharmacologic treatment. Lab Results  Component Value Date   CHOL 220 (H) 11/10/2017   HDL 62.50 11/10/2017   LDLCALC 142 (H) 11/10/2017   LDLDIRECT 134.0 03/18/2012   TRIG 75.0 11/10/2017   CHOLHDL 4 11/10/2017    Insomnia, currently he is on Ativan 0.5 mg as needed at bedtime. Taking medication for years, still tolerating well.   Review of Systems  Constitutional: Negative for activity change, appetite change, fatigue and fever.  HENT: Negative for dental problem, nosebleeds, sore throat, trouble swallowing and voice change.   Eyes: Negative for redness and visual disturbance.  Respiratory: Negative for cough, shortness of breath and wheezing.   Cardiovascular: Negative for chest pain, palpitations and leg swelling.  Gastrointestinal: Negative for abdominal pain, blood in stool, nausea and vomiting.  Endocrine: Negative for polydipsia, polyphagia and polyuria.  Genitourinary: Negative for decreased urine volume, genital sores, hematuria and testicular pain.  Musculoskeletal: Negative for gait problem, joint  swelling and myalgias.  Skin: Negative for color change and rash.  Allergic/Immunologic: Negative for environmental allergies.  Neurological: Negative for syncope, weakness, numbness and headaches.  Hematological: Negative for adenopathy. Does not bruise/bleed easily.  Psychiatric/Behavioral: Positive for sleep disturbance. Negative for confusion. The patient is not nervous/anxious.      Current Outpatient Medications on File Prior to Visit  Medication Sig Dispense Refill    acyclovir (ZOVIRAX) 200 MG capsule TAKE AS DIRECTED 90 capsule 0   aspirin 81 MG tablet Take 81 mg by mouth daily.       diphenhydramine-acetaminophen (TYLENOL PM) 25-500 MG TABS Take 1 tablet by mouth at bedtime as needed. sleep      LORazepam (ATIVAN) 0.5 MG tablet Take 1 tablet (0.5 mg total) by mouth at bedtime. 30 tablet 1   Multiple Vitamin (MULTI-DAY VITAMINS PO) Take by mouth as needed.      omeprazole (PRILOSEC) 20 MG capsule TAKE 1 CAPSULE (20 MG TOTAL) BY MOUTH DAILY. 90 capsule 3   OVER THE COUNTER MEDICATION Co q 10 120mg  daily     OVER THE COUNTER MEDICATION Magnesium     vitamin E (VITAMIN E) 400 UNIT capsule Take 400 Units by mouth daily.     No current facility-administered medications on file prior to visit.      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/2008    Past Surgical History:  Procedure Laterality Date   COLONOSCOPY     MELANOMA EXCISION     left cheek/face   NOSE SURGERY     Deviated Septum    Allergies  Allergen Reactions   Tetracycline Hcl     REACTION: blister on genitals    Family History  Problem Relation Age of Onset   Colon cancer Mother     Social History   Socioeconomic History   Marital status: Single    Spouse name: Not on file   Number of children: Not on file   Years of education: Not on file   Highest education level: Not on file  Occupational History   Not on file  Social Needs   Financial resource strain: Not on file   Food insecurity    Worry: Not on file    Inability: Not on file   Transportation needs    Medical: Not on file    Non-medical: Not on file  Tobacco Use   Smoking status: Never Smoker   Smokeless tobacco: Never Used  Substance and Sexual Activity   Alcohol use:  Yes    Alcohol/week: 3.0 standard drinks    Types: 3 Cans of beer per week    Comment: mixture of beer, wine, liquor   Drug use: No   Sexual activity: Not on file  Lifestyle   Physical activity    Days per week: Not on file    Minutes per session: Not on file   Stress: Not on file  Relationships   Social connections    Talks on phone: Not on file    Gets together: Not on file    Attends religious service: Not on file    Active member of club or organization: Not on file    Attends meetings of clubs or organizations: Not on file    Relationship status: Not  on file  Other Topics Concern   Not on file  Social History Narrative   Single   Gets regular exercise     Vitals:   11/28/18 0856 11/28/18 0949  BP: (!) 142/82 140/80  Pulse: 71   Resp: 12   Temp: 97.8 F (36.6 C)   SpO2: 95%    Body mass index is 25.52 kg/m.   Wt Readings from Last 3 Encounters:  11/28/18 172 lb 12.8 oz (78.4 kg)  09/27/18 178 lb (80.7 kg)  07/18/18 174 lb 6 oz (79.1 kg)    Physical Exam  Nursing note and vitals reviewed. Constitutional: He is oriented to person, place, and time. He appears well-developed. No distress.  HENT:  Head: Atraumatic.  Right Ear: Tympanic membrane, external ear and ear canal normal.  Left Ear: Tympanic membrane, external ear and ear canal normal.  Mouth/Throat: Oropharynx is clear and moist and mucous membranes are normal.  Eyes: Pupils are equal, round, and reactive to light. Conjunctivae and EOM are normal.  Neck: Normal range of motion. No tracheal deviation present. No thyromegaly present.  Cardiovascular: Normal rate and regular rhythm.  No murmur heard. Pulses:      Dorsalis pedis pulses are 2+ on the right side and 2+ on the left side.  Respiratory: Effort normal and breath sounds normal. No respiratory distress.  GI: Soft. He exhibits no mass. There is no hepatomegaly. There is no abdominal tenderness.  Genitourinary:    Genitourinary Comments:  Refused,no concerns.   Musculoskeletal:        General: No tenderness or edema.     Comments: No major deformities appreciated and no signs of synovitis.  Lymphadenopathy:    He has no cervical adenopathy.       Right: No supraclavicular adenopathy present.       Left: No supraclavicular adenopathy present.  Neurological: He is alert and oriented to person, place, and time. He has normal strength. No cranial nerve deficit or sensory deficit. Coordination and gait normal.  Reflex Scores:      Bicep reflexes are 2+ on the right side and 2+ on the left side.      Patellar reflexes are 2+ on the right side and 2+ on the left side. Skin: Skin is warm. No erythema.  Psychiatric: He has a normal mood and affect. Cognition and memory are normal.  Well groomed, good eye contact.     ASSESSMENT AND PLAN:  Mr. Jjesus was seen today for annual exam and medicare wellness.  Diagnoses and all orders for this visit:  Orders Placed This Encounter  Procedures   Comprehensive metabolic panel   Lipid panel   PSA(Must document that pt has been informed of limitations of PSA testing.)   Hemoglobin A1c   Lab Results  Component Value Date   HGBA1C 5.7 11/28/2018   Lab Results  Component Value Date   PSA 1.10 11/28/2018   Lab Results  Component Value Date   ALT 12 11/28/2018   AST 17 11/28/2018   ALKPHOS 46 11/28/2018   BILITOT 1.2 11/28/2018    Lab Results  Component Value Date   CREATININE 0.93 11/28/2018   BUN 27 (H) 11/28/2018   NA 141 11/28/2018   K 4.2 11/28/2018   CL 106 11/28/2018   CO2 24 11/28/2018    Routine general medical examination at a health care facility We discussed the importance of regular physical activity and healthy diet for prevention of chronic illness and/or complications. Preventive guidelines reviewed.  Vaccination updated, pneumovax given.  BP mildly elevated,recommend monitoring BP at home.  Next CPE in a year.  The 10-year ASCVD risk score  Mikey Bussing DC Brooke Bonito., et al., 2013) is: 13.9%   Values used to calculate the score:     Age: 89 years     Sex: Male     Is Non-Hispanic African American: No     Diabetic: No     Tobacco smoker: No     Systolic Blood Pressure: 277 mmHg     Is BP treated: No     HDL Cholesterol: 76.8 mg/dL     Total Cholesterol: 205 mg/dL  Medicare annual wellness visit, subsequent We discussed the importance of staying active, physically and mentally, as well as the benefits of a healthy/balance diet. Low impact exercise that involve stretching and strengthing are ideal. We discussed preventive screening for the next 5-10 years, summery of recommendations given in AVS:  Colonoscopy in 2022  Glaucoma screening/eye exam every 1-2 years.  Flu vaccine annually.  Diabetes screening annually.  Fall prevention  Advance directives and end of life discussed; Strongly recommended, information on AVS given.    Hyperlipidemia, unspecified hyperlipidemia type Continue non pharmacologic treatment. Further recommendations according to 10 years CVD risk. -     Comprehensive metabolic panel -     Lipid panel   Hyperglycemia Mildly elevated FG in the past, last 106 (11/2017) -     Hemoglobin A1c  Prostate cancer screening -     PSA(Must document that pt has been informed of limitations of PSA testing.)   Insomnia Problem is well controlled. Good sleep hygiene. No changes in current management. Follow-up in 6 months.  ED (erectile dysfunction) Problem is well controlled with Cialis 20 mg daily as needed. He understands possible side effects. Prescription given.  Hyperlipidemia Continue nonpharmacologic treatment, low-fat diet recommended. Further recommendation will be given according to lipid panel results as well as 10 years risk CVD score.    Return in 6 months (on 05/30/2019) for insomnia.    Girlie Veltri G. Martinique, MD  [facility]. [place] office.

## 2018-11-28 NOTE — Patient Instructions (Signed)
  A few tips:  -As we age balance is not as good as it was, so there is a higher risks for falls. Please remove small rugs and furniture that is "in your way" and could increase the risk of falls. Stretching exercises may help with fall prevention: Yoga and Tai Chi are some examples. Low impact exercise is better, so you are not very achy the next day.  -Sun screen and avoidance of direct sun light recommended. Caution with dehydration, if working outdoors be sure to drink enough fluids.  - Some medications are not safe as we age, increases the risk of side effects and can potentially interact with other medication you are also taken;  including some of over the counter medications. Be sure to let me know when you start a new medication even if it is a dietary/vitamin supplement.   -Healthy diet low in red meet/animal fat and sugar + regular physical activity is recommended.       Screening schedule for the next 5-10 years:  Colonoscopy in 2022  Glaucoma screening/eye exam every 1-2 years.  Flu vaccine annually.  Diabetes screening annually.  Fall prevention   Abdominal aortic aneurysm screening if you ever smoke.    Advance directives:  Please see a lawyer and/or go to this website to help you with advanced directives and designating a health care power of attorney so that your wishes will be followed should you become too ill to make your own medical decisions.  [email or web address]

## 2018-11-28 NOTE — Assessment & Plan Note (Signed)
Problem is well controlled with Cialis 20 mg daily as needed. He understands possible side effects. Prescription given.

## 2018-11-28 NOTE — Assessment & Plan Note (Signed)
Problem is well controlled. Good sleep hygiene. No changes in current management. Follow-up in 6 months.

## 2018-11-28 NOTE — Assessment & Plan Note (Signed)
Continue nonpharmacologic treatment, low-fat diet recommended. Further recommendation will be given according to lipid panel results as well as 10 years risk CVD score.

## 2018-11-29 LAB — LIPID PANEL
Cholesterol: 205 mg/dL — ABNORMAL HIGH (ref 0–200)
HDL: 76.8 mg/dL (ref >39.00)
LDL Cholesterol: 115 mg/dL — ABNORMAL HIGH (ref 0–99)
NonHDL: 127.81
Total CHOL/HDL Ratio: 3
Triglycerides: 62 mg/dL (ref 0.0–149.0)
VLDL: 12.4 mg/dL (ref 0.0–40.0)

## 2018-11-29 LAB — COMPREHENSIVE METABOLIC PANEL
ALT: 12 U/L (ref 0–53)
AST: 17 U/L (ref 0–37)
Albumin: 4.4 g/dL (ref 3.5–5.2)
Alkaline Phosphatase: 46 U/L (ref 39–117)
BUN: 27 mg/dL — ABNORMAL HIGH (ref 6–23)
CO2: 24 mEq/L (ref 19–32)
Calcium: 9.5 mg/dL (ref 8.4–10.5)
Chloride: 106 mEq/L (ref 96–112)
Creatinine, Ser: 0.93 mg/dL (ref 0.40–1.50)
GFR: 80.96 mL/min (ref >60.00)
Glucose, Bld: 91 mg/dL (ref 70–99)
Potassium: 4.2 mEq/L (ref 3.5–5.1)
Sodium: 141 mEq/L (ref 135–145)
Total Bilirubin: 1.2 mg/dL (ref 0.2–1.2)
Total Protein: 6.3 g/dL (ref 6.0–8.3)

## 2018-12-03 ENCOUNTER — Encounter: Payer: Self-pay | Admitting: Family Medicine

## 2019-01-30 ENCOUNTER — Telehealth: Payer: Self-pay | Admitting: *Deleted

## 2019-01-30 NOTE — Telephone Encounter (Signed)
Refill request for Acyclovir 200 mg capsule, #90,0

## 2019-02-01 ENCOUNTER — Telehealth: Payer: Self-pay | Admitting: Family Medicine

## 2019-02-01 NOTE — Telephone Encounter (Signed)
Patient called in stating he is waiting for a response from pharmacy and PCP to see if medication is refilled. Patient stated he had requested this medication a few weeks back. Please advise.

## 2019-02-01 NOTE — Telephone Encounter (Signed)
Message sent to Dr. Jordan for review and approval. 

## 2019-02-02 NOTE — Telephone Encounter (Signed)
disregard

## 2019-02-06 NOTE — Telephone Encounter (Signed)
Pt called to check on status of acyclovir. Pt stated he has 2-3 flare ups per year.. Please advise.

## 2019-02-06 NOTE — Telephone Encounter (Signed)
See request °

## 2019-02-06 NOTE — Telephone Encounter (Signed)
Message sent to Dr. Jordan for review and approval. 

## 2019-02-06 NOTE — Telephone Encounter (Signed)
Since I have not filled this Rx,can you please ask for indication and number of flare ups per year. Thanks, BJ

## 2019-02-07 ENCOUNTER — Other Ambulatory Visit: Payer: Self-pay | Admitting: Family Medicine

## 2019-02-07 MED ORDER — ACYCLOVIR 200 MG PO CAPS: ORAL_CAPSULE | ORAL | 0 refills | Status: DC

## 2019-02-07 NOTE — Telephone Encounter (Signed)
Noted. Nothing further needed at this time.  

## 2019-02-07 NOTE — Telephone Encounter (Signed)
Rx sent. Thanks, BJ 

## 2019-02-09 ENCOUNTER — Other Ambulatory Visit: Payer: Self-pay | Admitting: Family Medicine

## 2019-02-09 DIAGNOSIS — G4700 Insomnia, unspecified: Secondary | ICD-10-CM

## 2019-02-16 ENCOUNTER — Other Ambulatory Visit: Payer: Self-pay | Admitting: Family Medicine

## 2019-02-16 ENCOUNTER — Telehealth: Payer: Self-pay

## 2019-02-16 DIAGNOSIS — G4700 Insomnia, unspecified: Secondary | ICD-10-CM

## 2019-02-16 NOTE — Telephone Encounter (Signed)
Copied from [place] [id number]. Topic: Quick Communication - Rx Refill/Question >> Feb 16, 2019 10:24 AM Carolyn Stare wrote: Medication LORazepam (ATIVAN) 0.5 MG tablet     pt upset that his RX was not called in last week  sais this was the 2nd time this has happen    Preferred Pharmacy  [facility] [address]   Agent: Please be advised that RX refills may take up to 3 business days. We ask that you follow-up with your pharmacy.

## 2019-02-16 NOTE — Telephone Encounter (Signed)
Okay for refill? Please advise 

## 2019-02-16 NOTE — Telephone Encounter (Signed)
Routing to Dr. Jordan for approval  

## 2019-02-16 NOTE — Telephone Encounter (Signed)
Message routed to PCP.

## 2019-02-16 NOTE — Telephone Encounter (Signed)
Dr. Jordan - Please advise on pt's request to transfer care. Thanks! 

## 2019-02-16 NOTE — Telephone Encounter (Signed)
Refill request received 02/09/19 electronically from pharmacy.

## 2019-02-16 NOTE — Telephone Encounter (Signed)
Copied from [place] [id number]. Topic: Appointment Scheduling - Transfer of Care >> Feb 16, 2019 10:35 AM Virl Axe D wrote: Pt is requesting to transfer FROM: [place] Pt is requesting to transfer TO: [name] Reason for requested transfer: Not given/Requested Dr. Yong Channel who is unavailable then asked for another male physician at [place] to patient's current PCP (transferring FROM).

## 2019-02-17 ENCOUNTER — Other Ambulatory Visit: Payer: Self-pay | Admitting: Family Medicine

## 2019-02-17 DIAGNOSIS — G4700 Insomnia, unspecified: Secondary | ICD-10-CM

## 2019-02-17 MED ORDER — LORAZEPAM 0.5 MG PO TABS: 0.5000 mg | ORAL_TABLET | Freq: Every day | ORAL | 0 refills | Status: DC

## 2019-02-17 NOTE — Telephone Encounter (Signed)
We did not discuss this med last visit,it was CPE.  If he calls pharmacy for refills, it is easier to do so. Last time he call for refills on acyclovir, which we had not discussed before, medication was sent already. Lorazepam prescription will be sent. Thanks, BJ

## 2019-02-17 NOTE — Telephone Encounter (Signed)
It is fine with me. ?Thanks ?

## 2019-02-22 NOTE — Telephone Encounter (Signed)
Noted  

## 2019-02-22 NOTE — Telephone Encounter (Signed)
Patient would like to transfer to Dr. [name], okay with Dr. Martinique.

## 2019-02-24 NOTE — Telephone Encounter (Signed)
Please contact patient to schedule TOC appt

## 2019-02-24 NOTE — Telephone Encounter (Signed)
Please advise 

## 2019-02-24 NOTE — Telephone Encounter (Signed)
Yes that is ok with me

## 2019-03-15 ENCOUNTER — Other Ambulatory Visit: Payer: Self-pay | Admitting: Family Medicine

## 2019-03-15 DIAGNOSIS — G4700 Insomnia, unspecified: Secondary | ICD-10-CM

## 2019-03-24 ENCOUNTER — Other Ambulatory Visit: Payer: Self-pay

## 2019-03-24 ENCOUNTER — Ambulatory Visit: Payer: Medicare Other | Admitting: Family Medicine

## 2019-03-24 ENCOUNTER — Encounter: Payer: Self-pay | Admitting: Family Medicine

## 2019-03-24 VITALS — Ht 69.0 in | Wt 176.5 lb

## 2019-03-24 DIAGNOSIS — Z6826 Body mass index (BMI) 26.0-26.9, adult: Secondary | ICD-10-CM

## 2019-03-24 DIAGNOSIS — E663 Overweight: Secondary | ICD-10-CM | POA: Diagnosis not present

## 2019-03-24 DIAGNOSIS — G4700 Insomnia, unspecified: Secondary | ICD-10-CM

## 2019-03-24 DIAGNOSIS — Z85820 Personal history of malignant melanoma of skin: Secondary | ICD-10-CM | POA: Insufficient documentation

## 2019-03-24 DIAGNOSIS — N529 Male erectile dysfunction, unspecified: Secondary | ICD-10-CM

## 2019-03-24 DIAGNOSIS — K219 Gastro-esophageal reflux disease without esophagitis: Secondary | ICD-10-CM

## 2019-03-24 DIAGNOSIS — Z23 Encounter for immunization: Secondary | ICD-10-CM

## 2019-03-24 MED ORDER — LORAZEPAM 0.5 MG PO TABS: 0.5000 mg | ORAL_TABLET | Freq: Every day | ORAL | 1 refills | Status: DC

## 2019-03-24 MED ORDER — OMEPRAZOLE 20 MG PO CPDR: DELAYED_RELEASE_CAPSULE | ORAL | 3 refills | Status: DC

## 2019-03-24 NOTE — Assessment & Plan Note (Signed)
Stable.  Continue omeprazole 20mg daily

## 2019-03-24 NOTE — Assessment & Plan Note (Signed)
Stable.  Continue Cialis 20 mg daily as needed.

## 2019-03-24 NOTE — Assessment & Plan Note (Signed)
Continue management per dermatology.  

## 2019-03-24 NOTE — Progress Notes (Signed)
Chief Complaint:  Robert Cowan is a 68 y.o. male who presents today with a chief complaint of insomnia and to transfer care to this office.   Assessment/Plan:  Insomnia Refilled Ativan.  Recommended he take Benadryl alone instead of Tylenol PM.  He will follow-up in 6 months.  GERD Stable.  Continue omeprazole 20 mg daily.  History of melanoma Continue management per dermatology.  ED (erectile dysfunction) Stable.  Continue Cialis 20 mg daily as needed.  Preventive healthcare Pneumonia vaccine given today.  Body mass index is 26.06 kg/m. 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   ROS: Per HPI, otherwise a complete review of systems was negative.   PMH:  The following were reviewed and entered/updated in epic: Past Medical History:  Diagnosis Date  . DIVERTICULOSIS, COLON 02/02/2007  . GERD 01/28/2007  . Insomnia   . KIDNEY STONE 04/19/2008  . Melanoma ([place]) 2005  . 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 RENAL CALCULUS, LEFT 04/19/2008  . 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 History of melanoma 03/24/2019  . ED (erectile dysfunction) 11/28/2018  . Hyperlipidemia 07/18/2018  . Insomnia 07/18/2018  . 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 GERD 01/28/2007   Past Surgical History:  Procedure Laterality Date  . COLONOSCOPY    . MELANOMA EXCISION     left cheek/face  . NOSE SURGERY     Deviated Septum    Family History  Problem Relation Age of Onset  . Colon cancer Mother     Medications- reviewed and updated Current Outpatient Medications  Medication Sig Dispense Refill  . acyclovir (ZOVIRAX) 200 MG capsule 200 mg 5 times per day as soon as symptoms start and for 5 days 90 capsule 0  . aspirin 81 MG tablet Take 81 mg by mouth daily.      Marland Kitchen Bioflavonoid Products (VITAMIN C PLUS PO) Take 400 mg by mouth.    . diphenhydramine-acetaminophen (TYLENOL PM) 25-500 MG TABS Take 1 tablet by mouth at bedtime as needed. sleep     . LORazepam (ATIVAN) 0.5 MG tablet Take 1 tablet (0.5 mg total) by mouth at bedtime. 90 tablet 1  . Multiple Vitamin (MULTI-DAY VITAMINS PO) Take by mouth as needed.     Marland Kitchen omeprazole (PRILOSEC) 20 MG capsule TAKE 1 CAPSULE (20 MG TOTAL) BY MOUTH DAILY. 90 capsule 3  . tadalafil (CIALIS) 20 MG tablet Take 1 tablet (20 mg total) by mouth daily as needed. 90 tablet 2   No current facility-administered medications for this visit.     Allergies-reviewed and updated Allergies  Allergen Reactions  . Tetracycline Hcl     REACTION: blister on genitals    Social History   Socioeconomic History  . Marital status: Married    Spouse name: Not on file  . Number  of children: Not on file  . Years of education: Not on file  . Highest education level: Not on file  Occupational History  . Not on file  Social Needs  . Financial resource strain: Not on file  . Food insecurity    Worry: Not on file    Inability: Not on file  . Transportation needs    Medical: Not on file    Non-medical: Not on file  Tobacco Use  . Smoking status: Never Smoker  . Smokeless tobacco: Never Used  Substance and Sexual Activity  . Alcohol use: Yes    Alcohol/week: 3.0 standard drinks    Types: 3 Cans of beer per week    Comment: mixture of beer, wine, liquor  . Drug use: No  . Sexual activity: Not on file  Lifestyle  . Physical activity    Days per week: Not on file     Minutes per session: Not on file  . Stress: Not on file  Relationships  . Social Herbalist on phone: Not on file    Gets together: Not on file    Attends religious service: Not on file    Active member of club or organization: Not on file    Attends meetings of clubs or organizations: Not on file    Relationship status: Not on file  Other Topics Concern  . Not on file  Social History Narrative   Single   Gets regular exercise        Objective:  Physical Exam: Ht 5\' 9"  (1.753 m)   Wt 176 lb 8 oz (80.1 kg)   BMI 26.06 kg/m   Gen: NAD, resting comfortably CV: Regular rate and rhythm with no murmurs appreciated Pulm: Normal work of breathing, clear to auscultation bilaterally with no crackles, wheezes, or rhonchi GI: Normal bowel sounds present. Soft, Nontender, Nondistended. MSK: No edema, cyanosis, or clubbing noted Skin: Warm, dry Neuro: Grossly normal, moves all extremities Psych: Normal affect and thought content  Time Spent: I spent >40 minutes face-to-face with the patient, with more than half spent on coordinating care and counseling for management plan for his insomnia, erectile dysfunction, GERD, HSV-2 infection, and melanoma.       [name]. [name], MD 03/24/2019 9:28 AM

## 2019-03-24 NOTE — Assessment & Plan Note (Signed)
Refilled Ativan.  Recommended he take Benadryl alone instead of Tylenol PM.  He will follow-up in 6 months.

## 2019-03-24 NOTE — Patient Instructions (Signed)
It was very nice to see you today!  I will refill your medications today.  Come back to see me in 6 months, or sooner if needed.  Take care, Dr [name]  Please try these tips to maintain a healthy lifestyle:   Eat at least 3 REAL meals and 1-2 snacks per day.  Aim for no more than 5 hours between eating.  If you eat breakfast, please do so within one hour of getting up.    Obtain twice as many fruits/vegetables as protein or carbohydrate foods for both lunch and dinner. (Half of each meal should be fruits/vegetables, one quarter protein, and one quarter starchy carbs)   Cut down on sweet beverages. This includes juice, soda, and sweet tea.    Exercise at least 150 minutes every week.

## 2019-06-18 ENCOUNTER — Encounter: Payer: Self-pay | Admitting: Family Medicine

## 2019-06-18 DIAGNOSIS — G4700 Insomnia, unspecified: Secondary | ICD-10-CM

## 2019-06-19 MED ORDER — LORAZEPAM 0.5 MG PO TABS: 0.5000 mg | ORAL_TABLET | Freq: Every day | ORAL | 5 refills | Status: DC

## 2019-06-19 NOTE — Telephone Encounter (Signed)
Rx sent in.  [name]. [name], MD 06/19/2019 3:17 PM

## 2019-06-28 ENCOUNTER — Other Ambulatory Visit: Payer: Self-pay

## 2019-06-28 ENCOUNTER — Ambulatory Visit: Payer: Medicare PPO | Attending: Internal Medicine

## 2019-06-28 DIAGNOSIS — Z23 Encounter for immunization: Secondary | ICD-10-CM | POA: Insufficient documentation

## 2019-06-28 NOTE — Progress Notes (Signed)
   Covid-19 Vaccination Clinic  Name:  Robert Cowan    MRN: [id number] DOB: 02/27/1951  06/28/2019  Robert Cowan was observed post Covid-19 immunization for 15 minutes without incidence. He was provided with Vaccine Information Sheet and instruction to access the V-Safe system.   Robert Cowan was instructed to call 911 with any severe reactions post vaccine: Marland Kitchen Difficulty breathing  . Swelling of your face and throat  . A fast heartbeat  . A bad rash all over your body  . Dizziness and weakness    Immunizations Administered    Name Date Dose VIS Date Route   Pfizer COVID-19 Vaccine 06/28/2019  5:47 PM 0.3 mL 05/19/2019 Intramuscular   Manufacturer: [place]   Lot: [id number]   [place]: [id number]

## 2019-07-17 ENCOUNTER — Ambulatory Visit: Payer: Medicare PPO | Attending: Internal Medicine

## 2019-07-17 DIAGNOSIS — Z23 Encounter for immunization: Secondary | ICD-10-CM | POA: Insufficient documentation

## 2019-07-17 NOTE — Progress Notes (Signed)
   Covid-19 Vaccination Clinic  Name:  Robert Cowan    MRN: [id number] DOB: 11-Mar-1951  07/17/2019  [name] was observed post Covid-19 immunization for 15 minutes without incidence. He was provided with Vaccine Information Sheet and instruction to access the V-Safe system.   [name] was instructed to call 911 with any severe reactions post vaccine: Marland Kitchen Difficulty breathing  . Swelling of your face and throat  . A fast heartbeat  . A bad rash all over your body  . Dizziness and weakness    Immunizations Administered    Name Date Dose VIS Date Route   Pfizer COVID-19 Vaccine 07/17/2019 10:43 AM 0.3 mL 05/19/2019 Intramuscular   Manufacturer: [place]   Lot: [id number]   [place]: [id number]

## 2019-08-01 DIAGNOSIS — L812 Freckles: Secondary | ICD-10-CM | POA: Diagnosis not present

## 2019-08-01 DIAGNOSIS — L570 Actinic keratosis: Secondary | ICD-10-CM | POA: Diagnosis not present

## 2019-08-01 DIAGNOSIS — L820 Inflamed seborrheic keratosis: Secondary | ICD-10-CM | POA: Diagnosis not present

## 2019-08-01 DIAGNOSIS — L821 Other seborrheic keratosis: Secondary | ICD-10-CM | POA: Diagnosis not present

## 2019-08-01 DIAGNOSIS — D1801 Hemangioma of skin and subcutaneous tissue: Secondary | ICD-10-CM | POA: Diagnosis not present

## 2019-08-24 ENCOUNTER — Encounter: Payer: Self-pay | Admitting: Family Medicine

## 2019-08-24 ENCOUNTER — Other Ambulatory Visit: Payer: Self-pay

## 2019-08-25 ENCOUNTER — Encounter: Payer: Self-pay | Admitting: Family Medicine

## 2019-08-25 ENCOUNTER — Ambulatory Visit: Payer: Medicare PPO | Admitting: Family Medicine

## 2019-08-25 VITALS — BP 130/80 | HR 67 | Temp 97.8°F | Ht 69.0 in | Wt 174.0 lb

## 2019-08-25 DIAGNOSIS — G4700 Insomnia, unspecified: Secondary | ICD-10-CM

## 2019-08-25 MED ORDER — ZOLPIDEM TARTRATE ER 12.5 MG PO TBCR: 12.5000 mg | EXTENDED_RELEASE_TABLET | Freq: Every evening | ORAL | 1 refills | Status: DC | PRN

## 2019-08-25 MED ORDER — DICLOFENAC SODIUM 75 MG PO TBEC: 75.0000 mg | DELAYED_RELEASE_TABLET | Freq: Two times a day (BID) | ORAL | 0 refills | Status: DC

## 2019-08-25 NOTE — Patient Instructions (Signed)
It was very nice to see you today!  I will send in Ambien.  We will start diclofenac.  I think you probably have a nominal muscle strain.  Let me know if your symptoms are not improving in the next couple of weeks and we will get an ultrasound to check for hernia.  Take care, Dr [name]  Please try these tips to maintain a healthy lifestyle:   Eat at least 3 REAL meals and 1-2 snacks per day.  Aim for no more than 5 hours between eating.  If you eat breakfast, please do so within one hour of getting up.    Each meal should contain half fruits/vegetables, one quarter protein, and one quarter carbs (no bigger than a computer mouse)   Cut down on sweet beverages. This includes juice, soda, and sweet tea.     Drink at least 1 glass of water with each meal and aim for at least 8 glasses per day   Exercise at least 150 minutes every week.

## 2019-08-25 NOTE — Progress Notes (Signed)
   Robert Cowan is a 69 y.o. male who presents today for an office visit.  Assessment/Plan:  New/Acute Problems: Left lower abdominal pain Benign abdominal exam.  Possibly muscular strain.  Will start diclofenac 75 mg twice daily for the next 1 to 2 weeks.  Possibly could represent hernia as well.  No improvement the next several weeks will need ultrasound to further evaluate.  Chronic Problems Addressed Today: Insomnia Uncontrolled.  Will send in Ambien 12.5 mg nightly as he has done well with this.  We will stop Ativan.  He will check in with me in a couple weeks via MyChart.     Subjective:  HPI:  Patient has had left lower quadrant pain for the past 4 to 5 weeks.  Started when he was moving heavy boxes and lifting and turning.  Had some back pain that is resolved.  Describes a unusual sensation.  No constipation or diarrhea.  Worse with sitting up.  No dysuria.  No hematuria.  No specific treatments tried.  Symptoms are overall stable.  Is concerned about possible hernia.  He has also had issues with insomnia.  He is currently taking Ativan and Tylenol PM with no significant provement.  Is been on Ambien in the past and has done well with this.       Objective:  Physical Exam: BP 130/80   Pulse 67   Temp 97.8 F (36.6 C) (Temporal)   Ht 5\' 9"  (1.753 m)   Wt 174 lb (78.9 kg)   SpO2 97%   BMI 25.70 kg/m   Gen: No acute distress, resting comfortably CV: Regular rate and rhythm with no murmurs appreciated Pulm: Normal work of breathing, clear to auscultation bilaterally with no crackles, wheezes, or rhonchi GI: Bowel sounds present.  Soft, nontender, nondistended GU: No inguinal bulges or masses noted. Neuro: Grossly normal, moves all extremities Psych: Normal affect and thought content      Bisma Klett M. [name], MD 08/25/2019 10:49 AM

## 2019-08-25 NOTE — Assessment & Plan Note (Signed)
Uncontrolled.  Will send in Ambien 12.5 mg nightly as he has done well with this.  We will stop Ativan.  He will check in with me in a couple weeks via MyChart.

## 2019-09-09 ENCOUNTER — Encounter: Payer: Self-pay | Admitting: Family Medicine

## 2019-09-11 MED ORDER — LORAZEPAM 0.5 MG PO TABS: 0.5000 mg | ORAL_TABLET | Freq: Every evening | ORAL | 1 refills | Status: DC | PRN

## 2019-09-11 NOTE — Telephone Encounter (Signed)
Lorazepam is not on patient's medication list. Please advise. Thanks

## 2019-09-22 ENCOUNTER — Ambulatory Visit (INDEPENDENT_AMBULATORY_CARE_PROVIDER_SITE_OTHER): Payer: Medicare PPO | Admitting: Family Medicine

## 2019-09-22 ENCOUNTER — Other Ambulatory Visit: Payer: Self-pay

## 2019-09-22 ENCOUNTER — Encounter: Payer: Self-pay | Admitting: Family Medicine

## 2019-09-22 VITALS — BP 122/76 | HR 75 | Temp 97.6°F | Ht 69.0 in | Wt 171.2 lb

## 2019-09-22 DIAGNOSIS — G4700 Insomnia, unspecified: Secondary | ICD-10-CM | POA: Diagnosis not present

## 2019-09-22 DIAGNOSIS — N529 Male erectile dysfunction, unspecified: Secondary | ICD-10-CM | POA: Diagnosis not present

## 2019-09-22 MED ORDER — TADALAFIL 20 MG PO TABS: 20.0000 mg | ORAL_TABLET | Freq: Every day | ORAL | 3 refills | Status: DC | PRN

## 2019-09-22 MED ORDER — TRAZODONE HCL 50 MG PO TABS: 25.0000 mg | ORAL_TABLET | Freq: Every evening | ORAL | 3 refills | Status: DC | PRN

## 2019-09-22 NOTE — Progress Notes (Signed)
   Robert Cowan is a 69 y.o. male who presents today for an office visit.  Assessment/Plan:  Chronic Problems Addressed Today: ED (erectile dysfunction) Stable on Cialis 20 mg daily as needed.  Will refill today.  Insomnia Not controlled.  Did not tolerate Ambien due to side effects.  Advised him to not mix Ambien and Ativan at the same time.  Will start trazodone 50 mg nightly.  He can continue using Ativan as needed as well.  Discussed potential side effects.  He will follow-up with me via MyChart in a few weeks.  If no improvement with trazodone will consider trial of hydroxyzine.    Subjective:  HPI:  See A/p.         Objective:  Physical Exam: BP 122/76   Pulse 75   Temp 97.6 F (36.4 C)   Ht 5\' 9"  (1.753 m)   Wt 171 lb 4 oz (77.7 kg)   SpO2 97%   BMI 25.29 kg/m   Gen: No acute distress, resting comfortably CV: Regular rate and rhythm with no murmurs appreciated Pulm: Normal work of breathing, clear to auscultation bilaterally with no crackles, wheezes, or rhonchi Neuro: Grossly normal, moves all extremities Psych: Normal affect and thought content      Cristina Ceniceros M. [name], MD 09/22/2019 9:44 AM

## 2019-09-22 NOTE — Patient Instructions (Signed)
It was very nice to see you today!  Please try the trazodone.  Send me a message in a few weeks let me know how it is working for you.  We will see you back in 6 months for your annual checkup with blood work, or sooner if needed.  Take care, Dr [name]  Please try these tips to maintain a healthy lifestyle:   Eat at least 3 REAL meals and 1-2 snacks per day.  Aim for no more than 5 hours between eating.  If you eat breakfast, please do so within one hour of getting up.    Each meal should contain half fruits/vegetables, one quarter protein, and one quarter carbs (no bigger than a computer mouse)   Cut down on sweet beverages. This includes juice, soda, and sweet tea.     Drink at least 1 glass of water with each meal and aim for at least 8 glasses per day   Exercise at least 150 minutes every week.

## 2019-09-22 NOTE — Assessment & Plan Note (Signed)
Not controlled.  Did not tolerate Ambien due to side effects.  Advised him to not mix Ambien and Ativan at the same time.  Will start trazodone 50 mg nightly.  He can continue using Ativan as needed as well.  Discussed potential side effects.  He will follow-up with me via MyChart in a few weeks.  If no improvement with trazodone will consider trial of hydroxyzine.

## 2019-09-22 NOTE — Assessment & Plan Note (Signed)
Stable on Cialis 20 mg daily as needed.  Will refill today.

## 2019-10-12 ENCOUNTER — Encounter: Payer: Self-pay | Admitting: Family Medicine

## 2019-10-12 NOTE — Telephone Encounter (Signed)
Patient Notified to contact the pharmacy for refill

## 2019-10-14 ENCOUNTER — Other Ambulatory Visit: Payer: Self-pay | Admitting: Family Medicine

## 2019-10-24 ENCOUNTER — Other Ambulatory Visit: Payer: Self-pay | Admitting: Family Medicine

## 2019-11-23 IMAGING — MR MR HEAD WO/W CM
12 series · 48 of 48 positions shown · IV contrast (14 ML Multihance)
Comparison: None.

CLINICAL DATA: Headache for 3 weeks with dizziness and tinnitus.

EXAM:
MRI HEAD WITHOUT AND WITH CONTRAST
TECHNIQUE: Multiplanar, multiecho pulse sequences of the brain and surrounding
structures were obtained without and with intravenous contrast.
CONTRAST:  14mL MULTIHANCE GADOBENATE DIMEGLUMINE 529 MG/ML IV SOLN

[Series 2: t1_se_sag · sagittal · 5.0mm · 0.45mm/px · 1 of 21 slices shown]
[im 1/21]
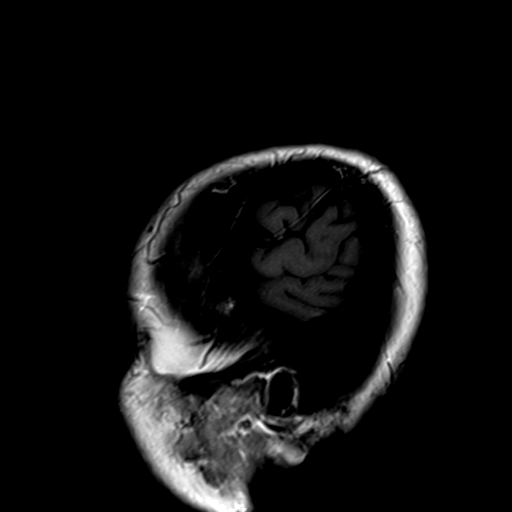

[Series 3: ep2d_diff_(id)_trace · axial · 3.0mm · 1.80mm/px · z∈[-73,+74]mm · 6 of 99 slices shown]
[im 1/99]
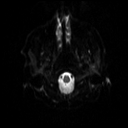
[im 20/99]
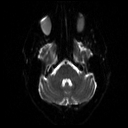
[im 40/99]
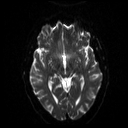
[im 59/99]
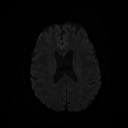
[im 79/99]
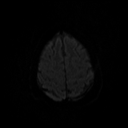
[im 99/99]
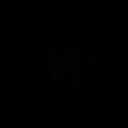

[Series 4: ep2d_diff_(id)_trace_adc · axial · 3.0mm · 1.80mm/px · z∈[-73,+74]mm · 3 of 47 slices shown]
[im 1/47]
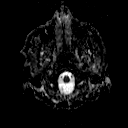
[im 24/47]
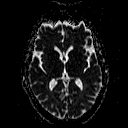
[im 47/47]
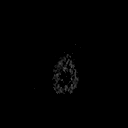

[Series 5: ep2d_diff_cor · coronal · 5.0mm · 1.77mm/px · 3 of 48 slices shown]
[im 1/48]
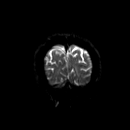
[im 24/48]
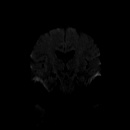
[im 48/48]
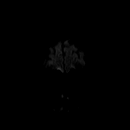

[Series 6: ep2d_diff_cor_adc · coronal · 5.0mm · 1.77mm/px · 2 of 24 slices shown]
[im 1/24]
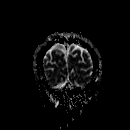
[im 24/24]
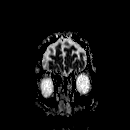

[Series 8: swi_images · axial · 2.0mm · 0.90mm/px · z∈[-78,+80]mm · 5 of 80 slices shown]
[im 1/80]
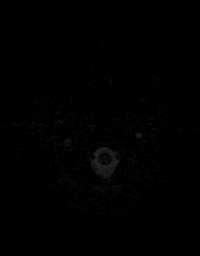
[im 20/80]
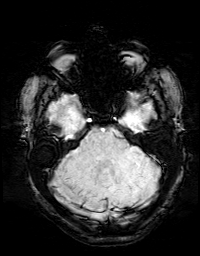
[im 40/80]
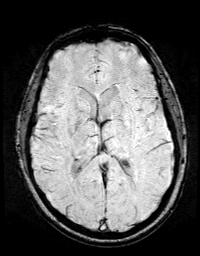
[im 60/80]
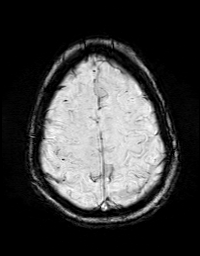
[im 80/80]
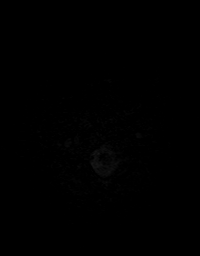

[Series 9: FLAIR · axial · 3.0mm · 0.43mm/px · z∈[-77,+78]mm · 2 of 27 slices shown]
[im 1/27]
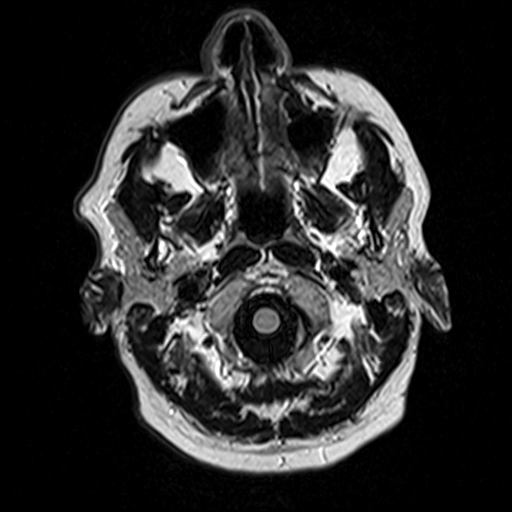
[im 27/27]
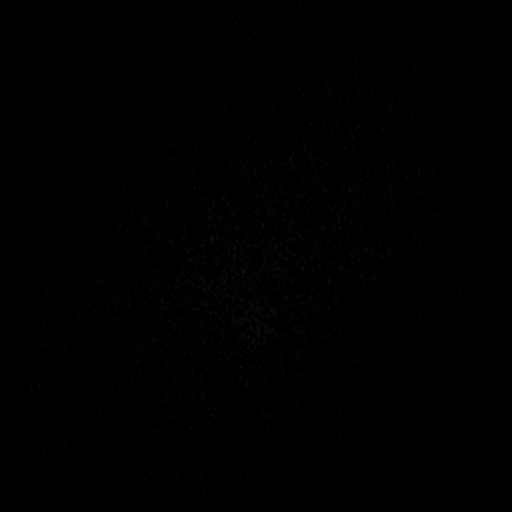

[Series 10: t2_tse_tra_512 · axial · 5.0mm · 0.60mm/px · z∈[-68,+70]mm · 2 of 24 slices shown]
[im 1/24]
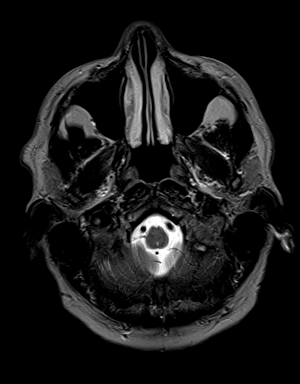
[im 24/24]
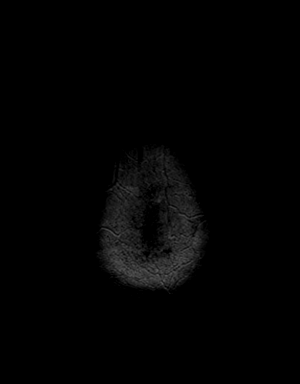

[Series 11: t1_mpr_tra · axial · 1.0mm · 0.72mm/px · z∈[-78,+81]mm · 10 of 160 slices shown]
[im 1/160]
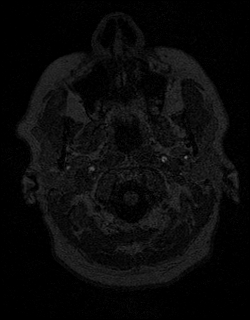
[im 18/160]
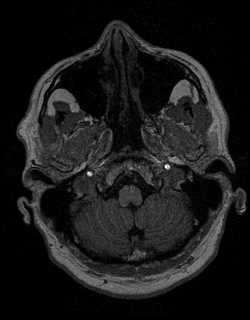
[im 36/160]
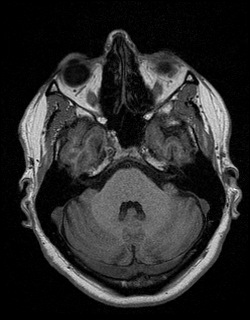
[im 54/160]
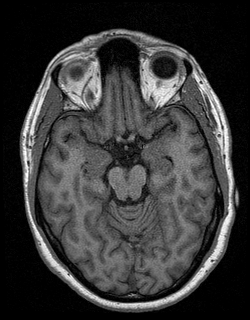
[im 71/160]
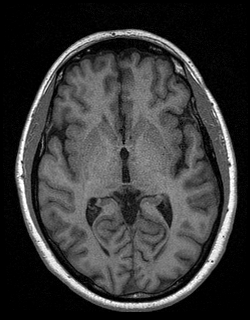
[im 89/160]
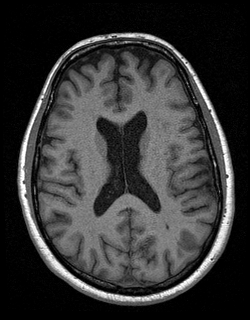
[im 107/160]
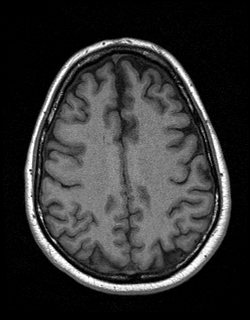
[im 124/160]
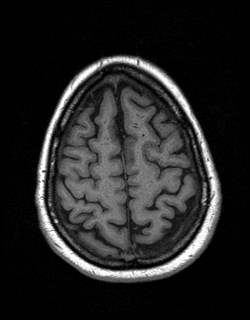
[im 142/160]
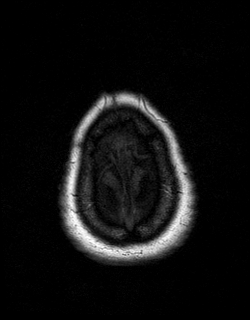
[im 160/160]
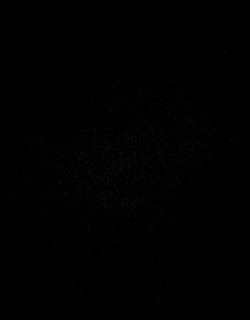

[Series 12: T2 · coronal · 5.0mm · 0.45mm/px · 2 of 26 slices shown]
[im 1/26]
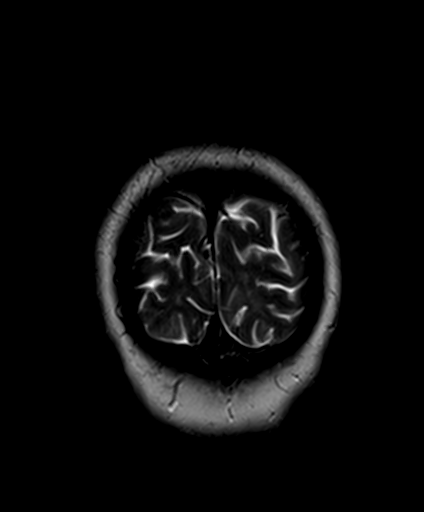
[im 26/26]
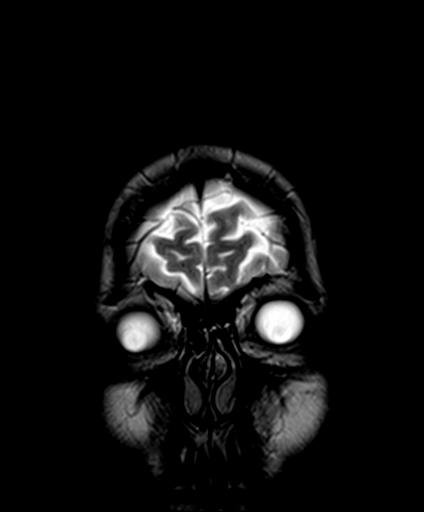

[Series 13: T1 post-contrast · coronal · 5.0mm · 0.72mm/px · 2 of 26 slices shown]
[im 1/26]
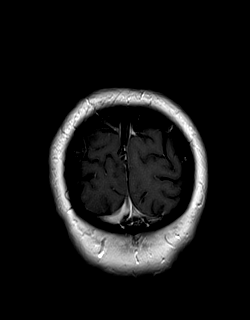
[im 26/26]
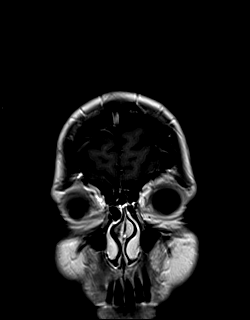

[Series 14: post t1_mpr_tra · axial · 1.0mm · 0.72mm/px · z∈[-78,+81]mm · 10 of 160 slices shown]
[im 1/160]
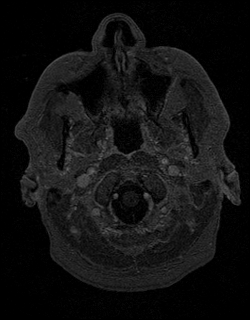
[im 18/160]
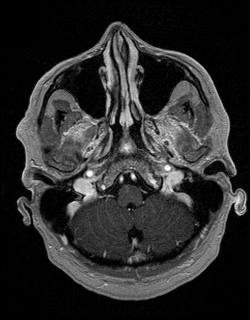
[im 36/160]
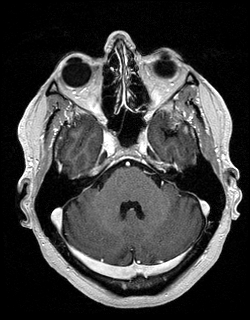
[im 54/160]
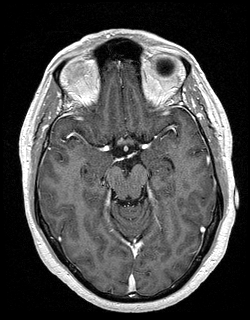
[im 71/160]
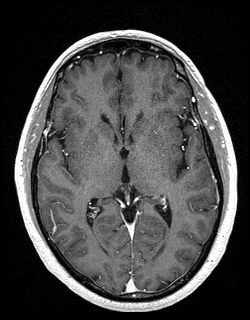
[im 89/160]
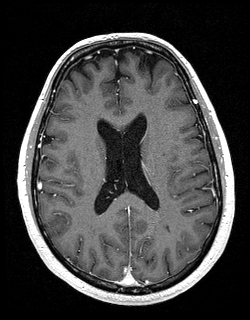
[im 107/160]
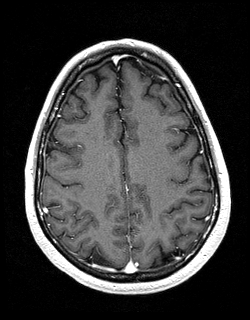
[im 124/160]
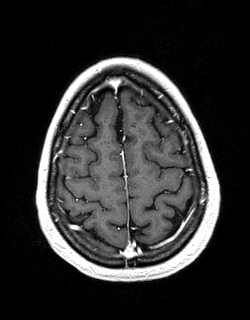
[im 142/160]
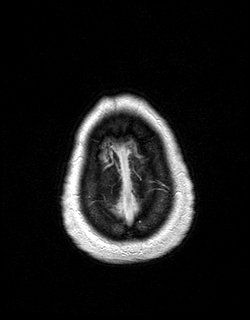
[im 160/160]
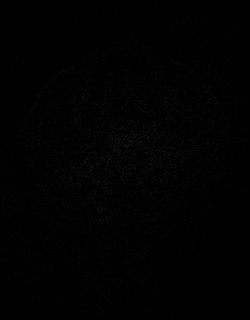

[48 of 48 positions shown; findings below may reference images not displayed]

FINDINGS: Brain: There is no evidence of acute infarct, intracranial
hemorrhage, mass, midline shift, or extra-axial fluid collection.
The ventricles and sulci are normal. A few punctate foci of
subcortical cerebral white matter T2 hyperintensity and minimal hazy
periventricular white matter T2 hyperintensity are within normal
limits for patient's age. No abnormal enhancement is identified.
Limited assessment of the seventh and eighth cranial nerve complexes
on this nondedicated study is unremarkable.

Vascular: Major intracranial vascular flow voids are preserved.

Skull and upper cervical spine: Unremarkable bone marrow signal.

Sinuses/Orbits: Bilateral cataract extraction. Paranasal sinuses and
mastoid air cells are clear.

Other: None.
IMPRESSION: Unremarkable appearance of the brain for age.

## 2019-12-07 ENCOUNTER — Ambulatory Visit (INDEPENDENT_AMBULATORY_CARE_PROVIDER_SITE_OTHER): Payer: Medicare PPO

## 2019-12-07 DIAGNOSIS — Z0000 Encounter for general adult medical examination without abnormal findings: Secondary | ICD-10-CM | POA: Diagnosis not present

## 2019-12-07 NOTE — Patient Instructions (Signed)
Robert Cowan , Thank you for taking time to come for your Medicare Wellness Visit. I appreciate your ongoing commitment to your health goals. Please review the following plan we discussed and let me know if I can assist you in the future.   Screening recommendations/referrals: Colonoscopy:Done 11/15/2015 Recommended yearly ophthalmology/optometry visit for glaucoma screening and checkup Recommended yearly dental visit for hygiene and checkup  Vaccinations: Influenza vaccine: Up to date Pneumococcal vaccine: Up to date Tdap vaccine: due Shingles vaccine: [facility] [place] [place] and [facility] [address] in [place]  Covid-19: Completed 1/20/ &07/17/19  Advanced directives: Advance directive discussed with you today. I have provided a copy for you to complete at home and have notarized. Once this is complete please bring a copy in to our office so we can scan it into your chart.   Conditions/risks identified: Pt will continue to maintain healthy  Next appointment: Follow up in one year for your annual wellness visit.   Preventive Care 24 Years and Older, Male Preventive care refers to lifestyle choices and visits with your health care provider that can promote health and wellness. What does preventive care include?  A yearly physical exam. This is also called an annual well check.  Dental exams once or twice a year.  Routine eye exams. Ask your health care provider how often you should have your eyes checked.  Personal lifestyle choices, including:  Daily care of your teeth and gums.  Regular physical activity.  Eating a healthy diet.  Avoiding tobacco and drug use.  Limiting alcohol use.  Practicing safe sex.  Taking low doses of aspirin every day.  Taking vitamin and mineral supplements as recommended by your health care provider. What happens during an annual well check? The services and screenings done by your health care provider during your annual well  check will depend on your age, overall health, lifestyle risk factors, and family history of disease. Counseling  Your health care provider may ask you questions about your:  Alcohol use.  Tobacco use.  Drug use.  Emotional well-being.  Home and relationship well-being.  Sexual activity.  Eating habits.  History of falls.  Memory and ability to understand (cognition).  Work and work Statistician. Screening  You may have the following tests or measurements:  Height, weight, and BMI.  Blood pressure.  Lipid and cholesterol levels. These may be checked every 5 years, or more frequently if you are over 47 years old.  Skin check.  Lung cancer screening. You may have this screening every year starting at age 63 if you have a 30-pack-year history of smoking and currently smoke or have quit within the past 15 years.  Fecal occult blood test (FOBT) of the stool. You may have this test every year starting at age 68.  Flexible sigmoidoscopy or colonoscopy. You may have a sigmoidoscopy every 5 years or a colonoscopy every 10 years starting at age 66.  Prostate cancer screening. Recommendations will vary depending on your family history and other risks.  Hepatitis C blood test.  Hepatitis B blood test.  Sexually transmitted disease (STD) testing.  Diabetes screening. This is done by checking your blood sugar (glucose) after you have not eaten for a while (fasting). You may have this done every 1-3 years.  Abdominal aortic aneurysm (AAA) screening. You may need this if you are a current or former smoker.  Osteoporosis. You may be screened starting at age 35 if you are at high risk. Talk with  your health care provider about your test results, treatment options, and if necessary, the need for more tests. Vaccines  Your health care provider may recommend certain vaccines, such as:  Influenza vaccine. This is recommended every year.  Tetanus, diphtheria, and acellular  pertussis (Tdap, Td) vaccine. You may need a Td booster every 10 years.  Zoster vaccine. You may need this after age 39.  Pneumococcal 13-valent conjugate (PCV13) vaccine. One dose is recommended after age 8.  Pneumococcal polysaccharide (PPSV23) vaccine. One dose is recommended after age 50. Talk to your health care provider about which screenings and vaccines you need and how often you need them. This information is not intended to replace advice given to you by your health care provider. Make sure you discuss any questions you have with your health care provider. Document Released: 06/21/2015 Document Revised: 02/12/2016 Document Reviewed: 03/26/2015 Elsevier Interactive Patient Education  2017 [employer] Prevention in the Home Falls can cause injuries. They can happen to people of all ages. There are many things you can do to make your home safe and to help prevent falls. What can I do on the outside of my home?  Regularly fix the edges of walkways and driveways and fix any cracks.  Remove anything that might make you trip as you walk through a door, such as a raised step or threshold.  Trim any bushes or trees on the path to your home.  Use bright outdoor lighting.  Clear any walking paths of anything that might make someone trip, such as rocks or tools.  Regularly check to see if handrails are loose or broken. Make sure that both sides of any steps have handrails.  Any raised decks and porches should have guardrails on the edges.  Have any leaves, snow, or ice cleared regularly.  Use sand or salt on walking paths during winter.  Clean up any spills in your garage right away. This includes oil or grease spills. What can I do in the bathroom?  Use night lights.  Install grab bars by the toilet and in the tub and shower. Do not use towel bars as grab bars.  Use non-skid mats or decals in the tub or shower.  If you need to sit down in the shower, use a plastic,  non-slip stool.  Keep the floor dry. Clean up any water that spills on the floor as soon as it happens.  Remove soap buildup in the tub or shower regularly.  Attach bath mats securely with double-sided non-slip rug tape.  Do not have throw rugs and other things on the floor that can make you trip. What can I do in the bedroom?  Use night lights.  Make sure that you have a light by your bed that is easy to reach.  Do not use any sheets or blankets that are too big for your bed. They should not hang down onto the floor.  Have a firm chair that has side arms. You can use this for support while you get dressed.  Do not have throw rugs and other things on the floor that can make you trip. What can I do in the kitchen?  Clean up any spills right away.  Avoid walking on wet floors.  Keep items that you use a lot in easy-to-reach places.  If you need to reach something above you, use a strong step stool that has a grab bar.  Keep electrical cords out of the way.  Do not  use floor polish or wax that makes floors slippery. If you must use wax, use non-skid floor wax.  Do not have throw rugs and other things on the floor that can make you trip. What can I do with my stairs?  Do not leave any items on the stairs.  Make sure that there are handrails on both sides of the stairs and use them. Fix handrails that are broken or loose. Make sure that handrails are as long as the stairways.  Check any carpeting to make sure that it is firmly attached to the stairs. Fix any carpet that is loose or worn.  Avoid having throw rugs at the top or bottom of the stairs. If you do have throw rugs, attach them to the floor with carpet tape.  Make sure that you have a light switch at the top of the stairs and the bottom of the stairs. If you do not have them, ask someone to add them for you. What else can I do to help prevent falls?  Wear shoes that:  Do not have high heels.  Have rubber  bottoms.  Are comfortable and fit you well.  Are closed at the toe. Do not wear sandals.  If you use a stepladder:  Make sure that it is fully opened. Do not climb a closed stepladder.  Make sure that both sides of the stepladder are locked into place.  Ask someone to hold it for you, if possible.  Clearly mark and make sure that you can see:  Any grab bars or handrails.  First and last steps.  Where the edge of each step is.  Use tools that help you move around (mobility aids) if they are needed. These include:  Canes.  Walkers.  Scooters.  Crutches.  Turn on the lights when you go into a dark area. Replace any light bulbs as soon as they burn out.  Set up your furniture so you have a clear path. Avoid moving your furniture around.  If any of your floors are uneven, fix them.  If there are any pets around you, be aware of where they are.  Review your medicines with your doctor. Some medicines can make you feel dizzy. This can increase your chance of falling. Ask your doctor what other things that you can do to help prevent falls. This information is not intended to replace advice given to you by your health care provider. Make sure you discuss any questions you have with your health care provider. Document Released: 03/21/2009 Document Revised: 10/31/2015 Document Reviewed: 06/29/2014 Elsevier Interactive Patient Education  2017 [employer].

## 2019-12-07 NOTE — Progress Notes (Signed)
Subjective:   Robert Cowan is a 69 y.o. male who presents for Medicare Annual/Subsequent preventive examination. Virtual Visit via Telephone Note  I connected with  Robert Cowan on 12/07/19 at 11:15 AM EDT by telephone and verified that I am speaking with the correct person using two identifiers.  Medicare Annual Wellness visit completed telephonically due to Covid-19 pandemic.   Location: Patient: Home Provider: Office Participating in cal are Health coach and Patient   I discussed the limitations, risks, security and privacy concerns of performing an evaluation and management service by telephone and the availability of in person appointments. The patient expressed understanding and agreed to proceed.  Unable to perform video visit due to video visit attempted and failed and/or patient does not have video capability.   Some vital signs may be absent or patient reported.   Willette Brace, LPN        Review of Systems     Cardiac Risk Factors include: male gender;dyslipidemia     Objective:    There were no vitals filed for this visit. There is no height or weight on file to calculate BMI.  Advanced Directives 12/07/2019 10/30/2015  Does Patient Have a Medical Advance Directive? No No  Would patient like information on creating a medical advance directive? Yes (MAU/Ambulatory/Procedural Areas - Information given) No - patient declined information    Current Medications (verified) Outpatient Encounter Medications as of 12/07/2019  Medication Sig   acyclovir (ZOVIRAX) 200 MG capsule TAKE 1 CAPSULE 5 TIMES A DAY AS SOON AS SYMPTOMS START AND CONTINUE FOR 5 DAYS   aspirin 81 MG tablet Take 81 mg by mouth daily.     Bioflavonoid Products (VITAMIN C PLUS PO) Take 400 mg by mouth.   LORazepam (ATIVAN) 0.5 MG tablet Take 1 tablet (0.5 mg total) by mouth at bedtime as needed for anxiety or sleep.   Multiple Vitamin (MULTI-DAY VITAMINS PO) Take by mouth as needed.     omeprazole (PRILOSEC) 20 MG capsule TAKE 1 CAPSULE (20 MG TOTAL) BY MOUTH DAILY.   tadalafil (CIALIS) 20 MG tablet Take 1 tablet (20 mg total) by mouth daily as needed.   traZODone (DESYREL) 50 MG tablet TAKE 0.5-2 TABLETS (25-100 MG TOTAL) BY MOUTH AT BEDTIME AS NEEDED FOR SLEEP. (Patient not taking: Reported on 12/07/2019)   No facility-administered encounter medications on file as of 12/07/2019.    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   Past Surgical History:  Procedure Laterality Date   COLONOSCOPY     MELANOMA EXCISION     left cheek/face   NOSE SURGERY     Deviated Septum   Family History  Problem Relation Age of Onset   Colon cancer Mother    Social History   Socioeconomic History   Marital status: Married    Spouse name: Not on file   Number of children: Not on file   Years of education: Not on file   Highest education level: Not on file  Occupational History   Occupation: Retired  Tobacco Use   Smoking status: Never Smoker   Smokeless tobacco: Never Used  Scientific laboratory technician Use: Never used  Substance and Sexual Activity   Alcohol use: Yes  Alcohol/week: 3.0 standard drinks    Types: 3 Cans of beer per week    Comment: mixture of beer, wine, liquor   Drug use: No   Sexual activity: Not on file  Other Topics Concern   Not on file  Social History Narrative   Single   Gets regular exercise   Social Determinants of Health   Financial Resource Strain: Low Risk    Difficulty of Paying Living Expenses: Not hard at all  Food Insecurity: No Food Insecurity   Worried About Charity fundraiser in the Last Year: Never true   Ran Out of Food in the Last Year: Never true    Transportation Needs: No Transportation Needs   Lack of Transportation (Medical): No   Lack of Transportation (Non-Medical): No  Physical Activity: Insufficiently Active   Days of Exercise per Week: 3 days   Minutes of Exercise per Session: 40 min  Stress: No Stress Concern Present   Feeling of Stress : Not at all  Social Connections: Moderately Integrated   Frequency of Communication with Friends and Family: More than three times a week   Frequency of Social Gatherings with Friends and Family: More than three times a week   Attends Religious Services: 1 to 4 times per year   Active Member of [employer] or Organizations: No   Attends Music therapist: Never   Marital Status: Married    Tobacco Counseling Counseling given: Not Answered   Clinical Intake:  Pre-visit preparation completed: Yes  Pain : No/denies pain     Diabetes: No  How often do you need to have someone help you when you read instructions, pamphlets, or other written materials from your doctor or pharmacy?: 1 - Never   Interpreter Needed?: No  Information entered by :: Charlott Rakes, LPN   Activities of Daily Living In your present state of health, do you have any difficulty performing the following activities: 12/07/2019  Hearing? N  Vision? N  Difficulty concentrating or making decisions? N  Walking or climbing stairs? N  Dressing or bathing? N  Doing errands, shopping? N  Preparing Food and eating ? N  Using the Toilet? N  In the past six months, have you accidently leaked urine? N  Do you have problems with loss of bowel control? N  Managing your Medications? N  Managing your Finances? N  Housekeeping or managing your Housekeeping? N  Some recent data might be hidden    Patient Care Team: Vivi Barrack, MD as PCP - General (Family Medicine)  Indicate any recent Medical Services you may have received from other than [facility] providers in the past year (date may be  approximate).     Assessment:   This is a routine wellness examination for [name].  Hearing/Vision screen  Hearing Screening   125Hz  250Hz  500Hz  1000Hz  2000Hz  3000Hz  4000Hz  6000Hz  8000Hz   Right ear:           Left ear:           Comments: Pt states a little diminished and ringing in ears at times, denies hearing aid at this time  Vision Screening Comments: Follows up in Sept for annual exam with [facility] in [place]  Dietary issues and exercise activities discussed: Current Exercise Habits: Home exercise routine, Type of exercise: walking (when weather permits), Time (Minutes): 45, Frequency (Times/Week): 3, Weekly Exercise (Minutes/Week): 135, Intensity: Moderate, Exercise limited by: None identified (Pt states he plays pickle ball on occassion)  Goals     Patient Stated     Pt will continue with exercise to stay healthy      Depression Screen PHQ 2/9 Scores 12/07/2019 08/25/2019 03/24/2019 11/28/2018 11/10/2017 11/03/2016 02/04/2016  PHQ - 2 Score 0 0 0 0 0 0 0  PHQ- 9 Score - - 0 - - - -    Fall Risk Fall Risk  12/07/2019 11/28/2018 11/10/2017 11/03/2016 09/22/2013  Falls in the past year? 0 0 No No No  Number falls in past yr: 0 0 - - -  Injury with Fall? 0 0 - - -  Risk for fall due to : No Fall Risks - - - -  Follow up Falls prevention discussed Education provided - - -    Any stairs in or around the home? Yes  If so, are there any without handrails? Yes  Home free of loose throw rugs in walkways, pet beds, electrical cords, etc? No  Adequate lighting in your home to reduce risk of falls? Yes   ASSISTIVE DEVICES UTILIZED TO PREVENT FALLS:  Life alert? No  Use of a cane, walker or w/c? No  Grab bars in the bathroom? No  Shower chair or bench in shower? Yes  Elevated toilet seat or a handicapped toilet? No   TIMED UP AND GO:  Was the test performed? No .      Cognitive Function:     6CIT Screen 12/07/2019  Count back from 20 0 points  Months in reverse 0  points  Repeat phrase 0 points    Immunizations Immunization History  Administered Date(s) Administered   Influenza Split 03/08/2012   Influenza, High Dose Seasonal PF 04/21/2017   Influenza,inj,Quad PF,6+ Mos 02/16/2019   PFIZER SARS-COV-2 Vaccination 06/28/2019, 07/17/2019   Pneumococcal Conjugate-13 11/10/2017   Pneumococcal Polysaccharide-23 03/24/2019   Td 02/06/1998, 12/28/2008   Zoster 10/05/2015    TDAP status: Due, Education has been provided regarding the importance of this vaccine. Advised may receive this vaccine at local pharmacy or Health Dept. Aware to provide a copy of the vaccination record if obtained from local pharmacy or Health Dept. Verbalized acceptance and understanding. Flu Vaccine status: Up to date Pneumococcal vaccine status: Up to date Covid-19 vaccine status: Completed vaccines  Qualifies for Shingles Vaccine? No   Zostavax completed Yes   Shingrix Completed?: Yes  Per patient Screening Tests Health Maintenance  Topic Date Due   TETANUS/TDAP  09/21/2020 (Originally 12/29/2018)   INFLUENZA VACCINE  01/07/2020   COLONOSCOPY  11/14/2020   COVID-19 Vaccine  Completed   Hepatitis C Screening  Completed   PNA vac Low Risk Adult  Completed    Health Maintenance  There are no preventive care reminders to display for this patient.  Colorectal cancer screening: Completed 11/15/15. Repeat every 5 years   Additional Screening:  Hepatitis C Screening: Completed 11/10/17  Vision Screening: Recommended annual ophthalmology exams for early detection of glaucoma and other disorders of the eye. Is the patient up to date with their annual eye exam?  Yes  Who is the provider or what is the name of the office in which the patient attends annual eye exams? @ [facility] in [place], [place]   Dental Screening: Recommended annual dental exams for proper oral hygiene  Community Resource Referral / Chronic Care Management: CRR required this visit?  No    CCM required this visit?  No      Plan:     I have personally reviewed and noted the  following in the patients chart:    Medical and social history  Use of alcohol, tobacco or illicit drugs   Current medications and supplements  Functional ability and status  Nutritional status  Physical activity  Advanced directives  List of other physicians  Hospitalizations, surgeries, and ER visits in previous 12 months  Vitals  Screenings to include cognitive, depression, and falls  Referrals and appointments  In addition, I have reviewed and discussed with patient certain preventive protocols, quality metrics, and best practice recommendations. A written personalized care plan for preventive services as well as general preventive health recommendations were provided to patient.     Willette Brace, LPN   07/13/4268   Nurse Notes: None

## 2019-12-17 ENCOUNTER — Other Ambulatory Visit: Payer: Self-pay | Admitting: Family Medicine

## 2019-12-17 DIAGNOSIS — G4700 Insomnia, unspecified: Secondary | ICD-10-CM

## 2019-12-18 NOTE — Telephone Encounter (Signed)
Rx request 

## 2020-01-08 DIAGNOSIS — Z20828 Contact with and (suspected) exposure to other viral communicable diseases: Secondary | ICD-10-CM | POA: Diagnosis not present

## 2020-01-08 DIAGNOSIS — J069 Acute upper respiratory infection, unspecified: Secondary | ICD-10-CM | POA: Diagnosis not present

## 2020-01-22 ENCOUNTER — Encounter: Payer: Self-pay | Admitting: Family Medicine

## 2020-01-22 ENCOUNTER — Telehealth: Payer: Self-pay

## 2020-01-22 NOTE — Telephone Encounter (Signed)
We can write a letter for him with the requested info but his wife will have to go through her PCP.  [name]. [name], MD 01/22/2020 10:47 AM

## 2020-01-22 NOTE — Telephone Encounter (Signed)
Pt is requesting a letter stating he had his covid vaccinations, so he can travel to [place]. He also asks if one could be written for his wife who is a [facility] patient. [phone]

## 2020-01-22 NOTE — Telephone Encounter (Signed)
Letter sent,up front for patient to pick up

## 2020-01-22 NOTE — Telephone Encounter (Signed)
Patient requesting a letter stating he has been vaccinated with Covid vaccines,including Doctors licence and registration # included. Please advise

## 2020-02-14 ENCOUNTER — Other Ambulatory Visit: Payer: Self-pay | Admitting: Family Medicine

## 2020-02-15 DIAGNOSIS — L570 Actinic keratosis: Secondary | ICD-10-CM | POA: Diagnosis not present

## 2020-02-15 DIAGNOSIS — L812 Freckles: Secondary | ICD-10-CM | POA: Diagnosis not present

## 2020-02-15 DIAGNOSIS — L821 Other seborrheic keratosis: Secondary | ICD-10-CM | POA: Diagnosis not present

## 2020-02-15 DIAGNOSIS — D1801 Hemangioma of skin and subcutaneous tissue: Secondary | ICD-10-CM | POA: Diagnosis not present

## 2020-03-04 DIAGNOSIS — Z961 Presence of intraocular lens: Secondary | ICD-10-CM | POA: Diagnosis not present

## 2020-03-07 ENCOUNTER — Other Ambulatory Visit: Payer: Self-pay

## 2020-03-07 DIAGNOSIS — G4700 Insomnia, unspecified: Secondary | ICD-10-CM

## 2020-03-07 MED ORDER — LORAZEPAM 0.5 MG PO TABS: 0.5000 mg | ORAL_TABLET | Freq: Every day | ORAL | 5 refills | Status: DC

## 2020-03-07 NOTE — Telephone Encounter (Signed)
LAST APPOINTMENT DATE: 01/22/2020   NEXT APPOINTMENT DATE: 03/25/2020   Rx Lorazapam LAST REFILL:  0712/2021  QTY: #30 Ref 5

## 2020-03-07 NOTE — Telephone Encounter (Signed)
MEDICATION: Lorazepam  PHARMACY: [facility] [address]  Comments: Pt is going on vacation next week and asked if he could get his new prescription early  **Let patient know to contact pharmacy at the end of the day to make sure medication is ready. **  ** Please notify patient to allow 48-72 hours to process**  **Encourage patient to contact the pharmacy for refills or they can request refills through [facility]**

## 2020-03-08 NOTE — Telephone Encounter (Signed)
This was filled on 03/07/20.

## 2020-03-25 ENCOUNTER — Other Ambulatory Visit: Payer: Self-pay

## 2020-03-25 ENCOUNTER — Encounter: Payer: Self-pay | Admitting: Family Medicine

## 2020-03-25 ENCOUNTER — Ambulatory Visit: Payer: Medicare PPO | Admitting: Family Medicine

## 2020-03-25 VITALS — BP 118/67 | HR 60 | Temp 97.9°F | Ht 69.0 in | Wt 161.2 lb

## 2020-03-25 DIAGNOSIS — Z125 Encounter for screening for malignant neoplasm of prostate: Secondary | ICD-10-CM | POA: Diagnosis not present

## 2020-03-25 DIAGNOSIS — Z0001 Encounter for general adult medical examination with abnormal findings: Secondary | ICD-10-CM

## 2020-03-25 DIAGNOSIS — E785 Hyperlipidemia, unspecified: Secondary | ICD-10-CM | POA: Insufficient documentation

## 2020-03-25 DIAGNOSIS — N529 Male erectile dysfunction, unspecified: Secondary | ICD-10-CM | POA: Diagnosis not present

## 2020-03-25 DIAGNOSIS — R739 Hyperglycemia, unspecified: Secondary | ICD-10-CM | POA: Diagnosis not present

## 2020-03-25 DIAGNOSIS — G4700 Insomnia, unspecified: Secondary | ICD-10-CM | POA: Diagnosis not present

## 2020-03-25 MED ORDER — TADALAFIL 20 MG PO TABS: 20.0000 mg | ORAL_TABLET | Freq: Every day | ORAL | 3 refills | Status: DC | PRN

## 2020-03-25 MED ORDER — ZOLPIDEM TARTRATE ER 6.25 MG PO TBCR: 6.2500 mg | EXTENDED_RELEASE_TABLET | Freq: Every evening | ORAL | 5 refills | Status: DC | PRN

## 2020-03-25 NOTE — Progress Notes (Signed)
Chief Complaint:  Robert Cowan is a 69 y.o. male who presents today for his annual comprehensive physical exam.    Assessment/Plan:  Chronic Problems Addressed Today: Insomnia Trazodone did not work.  Has been taking half dose of Ambien and has been doing well.  Will continue ambien 6.25mg  nightly as needed.  Follow-up in 6 months.  Dyslipidemia Check lipids.  Continue lifestyle modifications.  Hyperglycemia Check A1c.  ED (erectile dysfunction) Doing well with Cialis.  Will refill today.  Preventative Healthcare: Up-to-date on vaccines.  Check CBC, CMET, TSH, lipid panel.  Check PSA.  Patient Counseling(The following topics were reviewed and/or handout was given):  -Nutrition: Stressed importance of moderation in sodium/caffeine intake, saturated fat and cholesterol, caloric balance, sufficient intake of fresh fruits, vegetables, and fiber.  -Stressed the importance of regular exercise.   -Substance Abuse: Discussed cessation/primary prevention of tobacco, alcohol, or other drug use; driving or other dangerous activities under the influence; availability of treatment for abuse.   -Injury prevention: Discussed safety belts, safety helmets, smoke detector, smoking near bedding or upholstery.   -Sexuality: Discussed sexually transmitted diseases, partner selection, use of condoms, avoidance of unintended pregnancy and contraceptive alternatives.   -Dental health: Discussed importance of regular tooth brushing, flossing, and dental visits.  -Health maintenance and immunizations reviewed. Please refer to Health maintenance section.  Return to care in 1 year for next preventative visit.     Subjective:  HPI:  He has no acute complaints today.   Lifestyle Diet: Trying intermittent fasting. Getting plenty of fruits and vegetables.  Exercise: Likes to hike.   Depression screen PHQ 2/9 12/07/2019  Decreased Interest 0  Down, Depressed, Hopeless 0  PHQ - 2 Score 0  Altered  sleeping -  Change in appetite -  Feeling bad or failure about yourself  -  Trouble concentrating -  Moving slowly or fidgety/restless -  Suicidal thoughts -  PHQ-9 Score -  Difficult doing work/chores -    There are no preventive care reminders to display for this patient.   ROS: Per HPI, otherwise a complete review of systems was negative.   PMH:  The following were reviewed and entered/updated in epic: Past Medical History:  Diagnosis Date  . DIVERTICULOSIS, COLON 02/02/2007  . GERD 01/28/2007  . Insomnia   . KIDNEY STONE 04/19/2008  . Melanoma ([place]) 2005  . 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 RENAL CALCULUS, LEFT 04/19/2008  . 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 Hyperglycemia 03/25/2020  . Dyslipidemia 03/25/2020  . History of melanoma 03/24/2019  . ED (erectile dysfunction) 11/28/2018  . Insomnia 07/18/2018  . 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 GERD 01/28/2007   Past Surgical History:  Procedure Laterality Date  . COLONOSCOPY    . MELANOMA EXCISION     left cheek/face  . NOSE SURGERY     Deviated Septum    Family History  Problem Relation Age of Onset  . Colon cancer Mother     Medications- reviewed and updated Current Outpatient Medications  Medication Sig Dispense Refill  . acyclovir (ZOVIRAX) 200 MG capsule TAKE 1 CAPSULE BY MOUTH 5 TIMES A DAY AS SOON AS SYPTOMS START AND CONTINUE FOR 5 DAYS 90 capsule 0  . aspirin 81 MG tablet Take 81 mg by mouth daily.      Marland Kitchen Bioflavonoid Products (VITAMIN C PLUS PO) Take 400 mg by mouth.    . diphenhydramine-acetaminophen (TYLENOL PM) 25-500 MG TABS tablet  Take 1 tablet by mouth at bedtime as needed.    Marland Kitchen LORazepam (ATIVAN) 0.5 MG tablet Take 1 tablet (0.5 mg total) by mouth at bedtime. 30 tablet 5  . Melatonin 10 MG CAPS Take by mouth.    . Multiple Vitamin (MULTI-DAY VITAMINS PO) Take by mouth as needed.     Marland Kitchen omeprazole (PRILOSEC) 20 MG capsule TAKE 1 CAPSULE  (20 MG TOTAL) BY MOUTH DAILY. 90 capsule 3  . tadalafil (CIALIS) 20 MG tablet Take 1 tablet (20 mg total) by mouth daily as needed. 90 tablet 3  . zolpidem (AMBIEN CR) 6.25 MG CR tablet Take 1 tablet (6.25 mg total) by mouth at bedtime as needed for sleep. 30 tablet 5   No current facility-administered medications for this visit.    Allergies-reviewed and updated Allergies  Allergen Reactions  . Tetracycline Hcl     REACTION: blister on genitals    Social History   Socioeconomic History  . Marital status: Married    Spouse name: Not on file  . Number of children: Not on file  . Years of education: Not on file  . Highest education level: Not on file  Occupational History  . Occupation: Retired  Tobacco Use  . Smoking status: Never Smoker  . Smokeless tobacco: Never Used  Vaping Use  . Vaping Use: Never used  Substance and Sexual Activity  . Alcohol use: Yes    Alcohol/week: 3.0 standard drinks    Types: 3 Cans of beer per week    Comment: mixture of beer, wine, liquor  . Drug use: No  . Sexual activity: Not on file  Other Topics Concern  . Not on file  Social History Narrative   Single   Gets regular exercise   Social Determinants of Health   Financial Resource Strain: Low Risk   . Difficulty of Paying Living Expenses: Not hard at all  Food Insecurity: No Food Insecurity  . Worried About Charity fundraiser in the Last Year: Never true  . Ran Out of Food in the Last Year: Never true  Transportation Needs: No Transportation Needs  . Lack of Transportation (Medical): No  . Lack of Transportation (Non-Medical): No  Physical Activity: Insufficiently Active  . Days of Exercise per Week: 3 days  . Minutes of Exercise per Session: 40 min  Stress: No Stress Concern Present  . Feeling of Stress : Not at all  Social Connections: Moderately Integrated  . Frequency of Communication with Friends and Family: More than three times a week  . Frequency of Social Gatherings  with Friends and Family: More than three times a week  . Attends Religious Services: 1 to 4 times per year  . Active Member of Clubs or Organizations: No  . Attends Archivist Meetings: Never  . Marital Status: Married        Objective:  Physical Exam: BP 118/67   Pulse 60   Temp 97.9 F (36.6 C) (Temporal)   Ht 5\' 9"  (1.753 m)   Wt 161 lb 3.2 oz (73.1 kg)   SpO2 97%   BMI 23.81 kg/m   Body mass index is 23.81 kg/m. Wt Readings from Last 3 Encounters:  03/25/20 161 lb 3.2 oz (73.1 kg)  09/22/19 171 lb 4 oz (77.7 kg)  08/25/19 174 lb (78.9 kg)   Gen: NAD, resting comfortably HEENT: TMs normal bilaterally. OP clear. No thyromegaly noted.  CV: RRR with no murmurs appreciated Pulm: NWOB, CTAB with no crackles,  wheezes, or rhonchi GI: Normal bowel sounds present. Soft, Nontender, Nondistended. MSK: no edema, cyanosis, or clubbing noted Skin: warm, dry Neuro: CN2-12 grossly intact. Strength 5/5 in upper and lower extremities. Reflexes symmetric and intact bilaterally.  Psych: Normal affect and thought content     Carder Yin M. [name], MD 03/25/2020 9:40 AM

## 2020-03-25 NOTE — Assessment & Plan Note (Addendum)
Trazodone did not work.  Has been taking half dose of Ambien and has been doing well.  Will continue ambien 6.25mg  nightly as needed.  Follow-up in 6 months.

## 2020-03-25 NOTE — Assessment & Plan Note (Signed)
Doing well with Cialis.  Will refill today.

## 2020-03-25 NOTE — Assessment & Plan Note (Signed)
Check lipids.  Continue lifestyle modifications. 

## 2020-03-25 NOTE — Assessment & Plan Note (Signed)
Check A1c. 

## 2020-03-25 NOTE — Patient Instructions (Signed)
It was very nice to see you today!  We will check blood work.  I will send in your meds.  Come back in 6 months.  Come back to see me sooner if needed.  Take care, Dr [name]  Please try these tips to maintain a healthy lifestyle:   Eat at least 3 REAL meals and 1-2 snacks per day.  Aim for no more than 5 hours between eating.  If you eat breakfast, please do so within one hour of getting up.    Each meal should contain half fruits/vegetables, one quarter protein, and one quarter carbs (no bigger than a computer mouse)   Cut down on sweet beverages. This includes juice, soda, and sweet tea.     Drink at least 1 glass of water with each meal and aim for at least 8 glasses per day   Exercise at least 150 minutes every week.    Preventive Care 60 Years and Older, Male Preventive care refers to lifestyle choices and visits with your health care provider that can promote health and wellness. This includes:  A yearly physical exam. This is also called an annual well check.  Regular dental and eye exams.  Immunizations.  Screening for certain conditions.  Healthy lifestyle choices, such as diet and exercise. What can I expect for my preventive care visit? Physical exam Your health care provider will check:  Height and weight. These may be used to calculate body mass index (BMI), which is a measurement that tells if you are at a healthy weight.  Heart rate and blood pressure.  Your skin for abnormal spots. Counseling Your health care provider may ask you questions about:  Alcohol, tobacco, and drug use.  Emotional well-being.  Home and relationship well-being.  Sexual activity.  Eating habits.  History of falls.  Memory and ability to understand (cognition).  Work and work Statistician. What immunizations do I need?  Influenza (flu) vaccine  This is recommended every year. Tetanus, diphtheria, and pertussis (Tdap) vaccine  You may need a Td booster  every 10 years. Varicella (chickenpox) vaccine  You may need this vaccine if you have not already been vaccinated. Zoster (shingles) vaccine  You may need this after age 53. Pneumococcal conjugate (PCV13) vaccine  One dose is recommended after age 28. Pneumococcal polysaccharide (PPSV23) vaccine  One dose is recommended after age 108. Measles, mumps, and rubella (MMR) vaccine  You may need at least one dose of MMR if you were born in 1957 or later. You may also need a second dose. Meningococcal conjugate (MenACWY) vaccine  You may need this if you have certain conditions. Hepatitis A vaccine  You may need this if you have certain conditions or if you travel or work in places where you may be exposed to hepatitis A. Hepatitis B vaccine  You may need this if you have certain conditions or if you travel or work in places where you may be exposed to hepatitis B. Haemophilus influenzae type b (Hib) vaccine  You may need this if you have certain conditions. You may receive vaccines as individual doses or as more than one vaccine together in one shot (combination vaccines). Talk with your health care provider about the risks and benefits of combination vaccines. What tests do I need? Blood tests  Lipid and cholesterol levels. These may be checked every 5 years, or more frequently depending on your overall health.  Hepatitis C test.  Hepatitis B test. Screening  Lung cancer screening. You  may have this screening every year starting at age 68 if you have a 30-pack-year history of smoking and currently smoke or have quit within the past 15 years.  Colorectal cancer screening. All adults should have this screening starting at age 31 and continuing until age 78. Your health care provider may recommend screening at age 73 if you are at increased risk. You will have tests every 1-10 years, depending on your results and the type of screening test.  Prostate cancer screening.  Recommendations will vary depending on your family history and other risks.  Diabetes screening. This is done by checking your blood sugar (glucose) after you have not eaten for a while (fasting). You may have this done every 1-3 years.  Abdominal aortic aneurysm (AAA) screening. You may need this if you are a current or former smoker.  Sexually transmitted disease (STD) testing. Follow these instructions at home: Eating and drinking  Eat a diet that includes fresh fruits and vegetables, whole grains, lean protein, and low-fat dairy products. Limit your intake of foods with high amounts of sugar, saturated fats, and salt.  Take vitamin and mineral supplements as recommended by your health care provider.  Do not drink alcohol if your health care provider tells you not to drink.  If you drink alcohol: ? Limit how much you have to 0-2 drinks a day. ? Be aware of how much alcohol is in your drink. In the [place] one drink equals one 12 oz bottle of beer (355 mL), one 5 oz glass of wine (148 mL), or one 1 oz glass of hard liquor (44 mL). Lifestyle  Take daily care of your teeth and gums.  Stay active. Exercise for at least 30 minutes on 5 or more days each week.  Do not use any products that contain nicotine or tobacco, such as cigarettes, e-cigarettes, and chewing tobacco. If you need help quitting, ask your health care provider.  If you are sexually active, practice safe sex. Use a condom or other form of protection to prevent STIs (sexually transmitted infections).  Talk with your health care provider about taking a low-dose aspirin or statin. What's next?  Visit your health care provider once a year for a well check visit.  Ask your health care provider how often you should have your eyes and teeth checked.  Stay up to date on all vaccines. This information is not intended to replace advice given to you by your health care provider. Make sure you discuss any questions you have with  your health care provider. Document Revised: 05/19/2018 Document Reviewed: 05/19/2018 Elsevier Patient Education  2020 [employer].

## 2020-03-26 LAB — COMPREHENSIVE METABOLIC PANEL
AG Ratio: 2 (calc) (ref 1.0–2.5)
ALT: 11 U/L (ref 9–46)
AST: 20 U/L (ref 10–35)
Albumin: 4.5 g/dL (ref 3.6–5.1)
Alkaline phosphatase (APISO): 46 U/L (ref 35–144)
BUN: 24 mg/dL (ref 7–25)
CO2: 28 mmol/L (ref 20–32)
Calcium: 10.4 mg/dL — ABNORMAL HIGH (ref 8.6–10.3)
Chloride: 106 mmol/L (ref 98–110)
Creat: 1.04 mg/dL (ref 0.70–1.25)
Globulin: 2.2 g/dL (calc) (ref 1.9–3.7)
Glucose, Bld: 94 mg/dL (ref 65–99)
Potassium: 4.4 mmol/L (ref 3.5–5.3)
Sodium: 142 mmol/L (ref 135–146)
Total Bilirubin: 1.5 mg/dL — ABNORMAL HIGH (ref 0.2–1.2)
Total Protein: 6.7 g/dL (ref 6.1–8.1)

## 2020-03-26 LAB — CBC
HCT: 48.7 % (ref 38.5–50.0)
Hemoglobin: 16.3 g/dL (ref 13.2–17.1)
MCH: 30.8 pg (ref 27.0–33.0)
MCHC: 33.5 g/dL (ref 32.0–36.0)
MCV: 91.9 fL (ref 80.0–100.0)
MPV: 9.8 fL (ref 7.5–12.5)
Platelets: 215 10*3/uL (ref 140–400)
RBC: 5.3 10*6/uL (ref 4.20–5.80)
RDW: 12.7 % (ref 11.0–15.0)
WBC: 5.5 10*3/uL (ref 3.8–10.8)

## 2020-03-26 LAB — PSA: PSA: 1.05 ng/mL (ref <=4.0)

## 2020-03-26 LAB — TSH: TSH: 2.05 mIU/L (ref 0.40–4.50)

## 2020-03-26 LAB — LIPID PANEL
Cholesterol: 214 mg/dL — ABNORMAL HIGH (ref <200)
HDL: 80 mg/dL (ref >=40)
LDL Cholesterol (Calc): 119 mg/dL (calc) — ABNORMAL HIGH
Non-HDL Cholesterol (Calc): 134 mg/dL (calc) — ABNORMAL HIGH (ref <130)
Total CHOL/HDL Ratio: 2.7 (calc) (ref <5.0)
Triglycerides: 63 mg/dL (ref <150)

## 2020-03-26 LAB — HEMOGLOBIN A1C
Hgb A1c MFr Bld: 5.3 % of total Hgb (ref <5.7)
Mean Plasma Glucose: 105 (calc)
eAG (mmol/L): 5.8 (calc)

## 2020-03-27 ENCOUNTER — Other Ambulatory Visit: Payer: Self-pay

## 2020-03-27 DIAGNOSIS — R17 Unspecified jaundice: Secondary | ICD-10-CM

## 2020-03-27 NOTE — Progress Notes (Signed)
Please inform patient of the following:  Bilirubin elevated but everything else is stable.  This is likely nothing to worry about but would like to check another set of labs to make sure there is nothing else going on.  Please place future order for fractionated bilirubin.  We can recheck everything else in a year.

## 2020-04-12 ENCOUNTER — Other Ambulatory Visit: Payer: Self-pay | Admitting: Family Medicine

## 2020-04-20 ENCOUNTER — Other Ambulatory Visit: Payer: Self-pay | Admitting: Family Medicine

## 2020-05-13 ENCOUNTER — Telehealth: Payer: Self-pay

## 2020-05-13 DIAGNOSIS — Z20822 Contact with and (suspected) exposure to covid-19: Secondary | ICD-10-CM | POA: Diagnosis not present

## 2020-05-13 DIAGNOSIS — G4700 Insomnia, unspecified: Secondary | ICD-10-CM

## 2020-05-13 MED ORDER — LORAZEPAM 0.5 MG PO TABS: 0.5000 mg | ORAL_TABLET | Freq: Every day | ORAL | 5 refills | Status: DC

## 2020-05-13 NOTE — Addendum Note (Signed)
Addended by: Vivi Barrack on: 05/13/2020 09:57 AM   Modules accepted: Orders

## 2020-05-13 NOTE — Telephone Encounter (Signed)
..   LAST APPOINTMENT DATE: 04/20/2020   NEXT APPOINTMENT DATE:@4 /18/2022  MEDICATION:LORazepam (ATIVAN) 0.5 MG tablet

## 2020-05-13 NOTE — Telephone Encounter (Signed)
LAST APPOINTMENT DATE:03/25/2020   NEXT APPOINTMENT DATE: 09/23/2020    LAST REFILL:  03/25/2020  QTY: 30   Ref 5

## 2020-07-09 ENCOUNTER — Encounter: Payer: Self-pay | Admitting: Family Medicine

## 2020-07-09 DIAGNOSIS — G4700 Insomnia, unspecified: Secondary | ICD-10-CM

## 2020-07-09 DIAGNOSIS — Z20822 Contact with and (suspected) exposure to covid-19: Secondary | ICD-10-CM | POA: Diagnosis not present

## 2020-07-10 MED ORDER — LORAZEPAM 0.5 MG PO TABS: 0.5000 mg | ORAL_TABLET | Freq: Every day | ORAL | 0 refills | Status: DC

## 2020-07-23 ENCOUNTER — Other Ambulatory Visit: Payer: Self-pay | Admitting: Family Medicine

## 2020-08-14 DIAGNOSIS — L308 Other specified dermatitis: Secondary | ICD-10-CM | POA: Diagnosis not present

## 2020-08-14 DIAGNOSIS — L821 Other seborrheic keratosis: Secondary | ICD-10-CM | POA: Diagnosis not present

## 2020-08-14 DIAGNOSIS — L570 Actinic keratosis: Secondary | ICD-10-CM | POA: Diagnosis not present

## 2020-08-14 DIAGNOSIS — D1801 Hemangioma of skin and subcutaneous tissue: Secondary | ICD-10-CM | POA: Diagnosis not present

## 2020-08-14 DIAGNOSIS — L812 Freckles: Secondary | ICD-10-CM | POA: Diagnosis not present

## 2020-08-14 DIAGNOSIS — L820 Inflamed seborrheic keratosis: Secondary | ICD-10-CM | POA: Diagnosis not present

## 2020-09-09 ENCOUNTER — Other Ambulatory Visit: Payer: Self-pay | Admitting: Family Medicine

## 2020-09-23 ENCOUNTER — Ambulatory Visit: Payer: Medicare PPO | Admitting: Family Medicine

## 2020-09-23 ENCOUNTER — Other Ambulatory Visit (INDEPENDENT_AMBULATORY_CARE_PROVIDER_SITE_OTHER): Payer: Medicare PPO

## 2020-09-23 ENCOUNTER — Other Ambulatory Visit: Payer: Self-pay

## 2020-09-23 VITALS — BP 131/77 | HR 64 | Temp 97.6°F | Ht 69.0 in | Wt 168.0 lb

## 2020-09-23 DIAGNOSIS — R1032 Left lower quadrant pain: Secondary | ICD-10-CM | POA: Diagnosis not present

## 2020-09-23 DIAGNOSIS — R739 Hyperglycemia, unspecified: Secondary | ICD-10-CM

## 2020-09-23 DIAGNOSIS — M7712 Lateral epicondylitis, left elbow: Secondary | ICD-10-CM

## 2020-09-23 DIAGNOSIS — G4700 Insomnia, unspecified: Secondary | ICD-10-CM | POA: Diagnosis not present

## 2020-09-23 LAB — POCT GLYCOSYLATED HEMOGLOBIN (HGB A1C): Hemoglobin A1C: 5.3 % (ref 4.0–5.6)

## 2020-09-23 MED ORDER — LORAZEPAM 0.5 MG PO TABS: 0.5000 mg | ORAL_TABLET | Freq: Every day | ORAL | 0 refills | Status: DC

## 2020-09-23 MED ORDER — METHYLPREDNISOLONE ACETATE 40 MG/ML IJ SUSP
40.0000 mg | Freq: Once | INTRAMUSCULAR | Status: AC
  Administered 2020-09-23: 40 mg via INTRAMUSCULAR

## 2020-09-23 NOTE — Progress Notes (Signed)
   Robert Cowan is a 70 y.o. male who presents today for an office visit.  Assessment/Plan:  New/Acute Problems: Left Groin Pain No change over the past year.  Has upcoming colonoscopy later this year.  Would like to wait on this before getting ultrasound of the area.  Given he has not had any change in symptoms I think this is reasonable.  We will perform physical exam a year ago and did not notice any appreciable hernias or masses.  Left lateral epicondylitis Injection performed today.  He tolerated well.  See below procedure note.  He will let me know if not improving and we can consider topical nitroglycerin protocol.  Chronic Problems Addressed Today: Hyperglycemia A1c improved to 5.3. Continue lifestyle modifications.   Insomnia Stable.  Refilled Ativan today.     Subjective:  HPI:  Patient left lateral elbow.  Is been there for quite a while.  Has had tennis elbow in the past and feels like it is flared up again.  No obvious injuries or precipitating events.  Is otherwise doing well.  See A/P for status of chronic conditions.  He has had persistent fullness/pressure in left inguinal area.  We checked him for hernia about a year ago which was normal.  Symptoms of been stable over the past year.        Objective:  Physical Exam: BP 131/77   Pulse 64   Temp 97.6 F (36.4 C)   Ht 5\' 9"  (1.753 m)   Wt 168 lb (76.2 kg)   SpO2 99%   BMI 24.81 kg/m   Gen: No acute distress, resting comfortably Neuro: Grossly normal, moves all extremities Psych: Normal affect and thought content  Procedure Note Verbal consent was obtained.  Patient's left lateral epicondyles was cleansed with alcohol swab.  Topical ethyl chloride was applied for anesthesia.  A 3-1 mixture of 2% lidocaine without epinephrine and 40 mg/cc of Depo-Medrol was then injected into the point of maximal tenderness.  Needle was withdrawn.  Bandage was placed.  He tolerated well with no complications.  Minimal blood  loss.        [name]. [name], MD 09/23/2020 11:20 AM

## 2020-09-23 NOTE — Assessment & Plan Note (Addendum)
A1c improved to 5.3. Continue lifestyle modifications.

## 2020-09-23 NOTE — Assessment & Plan Note (Signed)
Stable.  Refilled Ativan today.

## 2020-09-23 NOTE — Patient Instructions (Signed)
It was very nice to see you today!  I am glad that you are doing well.  We injected your elbow today.  I will refill your medication.  I will see back in 6 months for your annual checkup.  Come back to see me sooner if needed.  Take care, Dr [name]  PLEASE NOTE:  If you had any lab tests please let us know if you have not heard back within a few days. You may see your results on mychart before we have a chance to review them but we will give you a call once they are reviewed by [place]. If we ordered any referrals today, please let us know if you have not heard from their office within the next week.   Please try these tips to maintain a healthy lifestyle:   Eat at least 3 REAL meals and 1-2 snacks per day.  Aim for no more than 5 hours between eating.  If you eat breakfast, please do so within one hour of getting up.    Each meal should contain half fruits/vegetables, one quarter protein, and one quarter carbs (no bigger than a computer mouse)   Cut down on sweet beverages. This includes juice, soda, and sweet tea.     Drink at least 1 glass of water with each meal and aim for at least 8 glasses per day   Exercise at least 150 minutes every week.

## 2020-09-29 ENCOUNTER — Other Ambulatory Visit: Payer: Self-pay | Admitting: Family Medicine

## 2020-10-02 ENCOUNTER — Other Ambulatory Visit: Payer: Self-pay | Admitting: Family Medicine

## 2020-10-07 DIAGNOSIS — Z20822 Contact with and (suspected) exposure to covid-19: Secondary | ICD-10-CM | POA: Diagnosis not present

## 2020-11-20 ENCOUNTER — Encounter: Payer: Self-pay | Admitting: Emergency Medicine

## 2020-11-20 ENCOUNTER — Other Ambulatory Visit: Payer: Self-pay

## 2020-11-20 ENCOUNTER — Ambulatory Visit
Admission: EM | Admit: 2020-11-20 | Discharge: 2020-11-20 | Disposition: A | Discharge status: Home / Self Care | Payer: Medicare PPO | Attending: Emergency Medicine | Admitting: Emergency Medicine

## 2020-11-20 DIAGNOSIS — S50862A Insect bite (nonvenomous) of left forearm, initial encounter: Secondary | ICD-10-CM

## 2020-11-20 DIAGNOSIS — Z20822 Contact with and (suspected) exposure to covid-19: Secondary | ICD-10-CM | POA: Diagnosis not present

## 2020-11-20 DIAGNOSIS — W57XXXA Bitten or stung by nonvenomous insect and other nonvenomous arthropods, initial encounter: Secondary | ICD-10-CM

## 2020-11-20 DIAGNOSIS — R5383 Other fatigue: Secondary | ICD-10-CM | POA: Diagnosis not present

## 2020-11-20 NOTE — Discharge Instructions (Addendum)
Blood work pending COVID test pending for confirmation Rest and fluids Tylenol and ibuprofen for sore throat Daily Zyrtec or Claritin for congestion and drainage Follow-up if not improving or worsening

## 2020-11-20 NOTE — ED Triage Notes (Signed)
Pt sts noticed insect bite to left forearm last week; pt sts was red and swollen but improved; pt sts now sore throat and fatigue x 3 days; neg covid test

## 2020-11-20 NOTE — ED Provider Notes (Addendum)
[facility]    CSN: [id number] Arrival date & time: 11/20/20  1637      History   Chief Complaint Chief Complaint  Patient presents with   Insect Bite   Sore Throat    HPI Robert Cowan is a 70 y.o. male history of GERD, presenting today for evaluation of insect bite and sore throat.  Reports that he noticed an insect bite to left forearm last week.  Reports that this area was red and swollen but has improved.  Today with sore throat and fatigue for approximately 3 days.  Reports significant fatigue and associated headaches.  Denies dizziness, lightheadedness, nausea vomiting, diarrhea or abdominal pain.  Denies rashes elsewhere.  Has had COVID test previously which was negative.  Expresses concern over possible spider bite, reports family member with complications from brown recluse.  Denies any known tick.  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 Authorizing Provider  zolpidem (AMBIEN CR) 6.25 MG CR tablet TAKE 1 TABLET BY MOUTH AT BEDTIME AS NEEDED FOR SLEEP. 09/30/20   Vivi Barrack, MD  acyclovir (ZOVIRAX) 200 MG capsule TAKE 1 CAPSULE BY MOUTH 5 TIMES A DAY AS SOON AS SYPTOMS START AND CONTINUE FOR 5 DAYS 10/02/20   Vivi Barrack, MD   aspirin 81 MG tablet Take 81 mg by mouth daily.    [provider]  Bioflavonoid Products (VITAMIN C PLUS PO) Take 400 mg by mouth.    [provider]  diphenhydramine-acetaminophen (TYLENOL PM) 25-500 MG TABS tablet Take 1 tablet by mouth at bedtime as needed.    [provider]  LORazepam (ATIVAN) 0.5 MG tablet Take 1 tablet (0.5 mg total) by mouth at bedtime. 09/23/20   Vivi Barrack, MD  Melatonin 10 MG CAPS Take by mouth.    [provider]  Multiple Vitamin (MULTI-DAY VITAMINS PO) Take by mouth as needed.    [provider]  omeprazole (PRILOSEC) 20 MG capsule TAKE 1 CAPSULE BY MOUTH EVERY DAY 04/21/20   Vivi Barrack, MD  tadalafil (CIALIS) 20 MG tablet Take 1 tablet (20 mg total) by mouth daily as needed. 03/25/20   Vivi Barrack, MD    Family History Family History  Problem Relation Age of Onset   Colon cancer Mother     Social History Social History   Tobacco Use   Smoking status: Never   Smokeless tobacco: Never  Vaping Use   Vaping Use: Never used  Substance Use Topics   Alcohol use: Yes    Alcohol/week: 3.0 standard drinks    Types: 3 Cans of beer per week    Comment: mixture of  beer, wine, liquor   Drug use: No     Allergies   Tetracycline hcl   Review of Systems Review of Systems  Constitutional:  Positive for fatigue. Negative for activity change, appetite change, chills and fever.  HENT:  Positive for sore throat. Negative for congestion, ear pain, rhinorrhea, sinus pressure and trouble swallowing.   Eyes:  Negative for discharge and redness.  Respiratory:  Negative for cough, chest tightness and shortness of breath.   Cardiovascular:  Negative for chest pain.  Gastrointestinal:  Negative for abdominal pain, diarrhea, nausea and vomiting.  Musculoskeletal:  Negative for myalgias.  Skin:  Positive for color change. Negative for rash.  Neurological:  Negative for dizziness, light-headedness and  headaches.    Physical Exam Triage Vital Signs ED Triage Vitals  Enc Vitals Group     BP 11/20/20 1747 (!) 146/75     Pulse Rate 11/20/20 1747 72     Resp 11/20/20 1747 18     Temp 11/20/20 1747 98.3 F (36.8 C)     Temp Source 11/20/20 1747 Oral     SpO2 11/20/20 1747 97 %     Weight --      Height --      Head Circumference --      Peak Flow --      Pain Score 11/20/20 1748 3     Pain Loc --      Pain Edu? --      Excl. in [place]? --    No data found.  Updated Vital Signs BP (!) 146/75 (BP Location: Left Arm)   Pulse 72   Temp 98.3 F (36.8 C) (Oral)   Resp 18   SpO2 97%   Visual Acuity Right Eye Distance:   Left Eye Distance:   Bilateral Distance:    Right Eye Near:   Left Eye Near:    Bilateral Near:     Physical Exam Vitals and nursing note reviewed.  Constitutional:      Appearance: He is well-developed.     Comments: No acute distress  HENT:     Head: Normocephalic and atraumatic.     Ears:     Comments: Bilateral ears without tenderness to palpation of external auricle, tragus and mastoid, EAC's without erythema or swelling, TM's with good bony landmarks and cone of light. Non erythematous.      Nose: Nose normal.     Mouth/Throat:     Comments: Oral mucosa pink and moist, no tonsillar enlargement or exudate. Posterior pharynx patent and nonerythematous, no uvula deviation or swelling. Normal phonation.  Eyes:     Conjunctiva/sclera: Conjunctivae normal.  Cardiovascular:     Rate and Rhythm: Normal rate.  Pulmonary:     Effort: Pulmonary effort is normal. No respiratory distress.     Comments: Breathing comfortably at rest, CTABL, no wheezing, rales or other adventitious sounds auscultated  Abdominal:     General: There is no distension.  Musculoskeletal:        General: Normal range of motion.     Cervical back: Neck supple.  Skin:    General: Skin is warm and dry.     Comments: Left forearm with small slightly erythematous papular papule  noted, mild induration, no surrounding erythema or warmth  Neurological:     Mental Status: He is alert and oriented to person, place, and time.     UC Treatments / Results  Labs (all labs ordered are listed, but only abnormal results are  displayed) Labs Reviewed  NOVEL CORONAVIRUS, NAA  CBC WITH DIFFERENTIAL/PLATELET  BASIC METABOLIC PANEL  ROCKY MTN SPOTTED FVR ABS PNL(IGG+IGM)  LYME DISEASE SEROLOGY W/REFLEX    EKG   Radiology No results found.  Procedures Procedures (including critical care time)  Medications Ordered in UC Medications - No data to display  Initial Impression / Assessment and Plan / UC Course  I have reviewed the triage vital signs and the nursing notes.  Pertinent labs & imaging results that were available during my care of the patient were reviewed by me and considered in my medical decision making (see chart for details).     Viral URI with cough-URI symptoms x3 days, will confirm home COVID with PCR today, recommending symptomatic and supportive care, lower suspicion of correlation to insect bite, but will continue to monitor Insect bite-area seems to be resolving, continue to monitor, may steroid cream topically to help with inflammation  Discussed strict return precautions. Patient verbalized understanding and is agreeable with plan.  Final Clinical Impressions(s) / UC Diagnoses   Final diagnoses:  Fatigue, unspecified type  Insect bite of left forearm, initial encounter     Discharge Instructions      Blood work pending COVID test pending for confirmation Rest and fluids Tylenol and ibuprofen for sore throat Daily Zyrtec or Claritin for congestion and drainage Follow-up if not improving or worsening     ED Prescriptions   None    PDMP not reviewed this encounter.   Janith Lima, PA-C 11/20/20 1856    Janith Lima, PA-C 11/20/20 1857

## 2020-11-21 LAB — NOVEL CORONAVIRUS, NAA: SARS-CoV-2, NAA: NOT DETECTED

## 2020-11-21 LAB — SARS-COV-2, NAA 2 DAY TAT

## 2020-11-22 ENCOUNTER — Other Ambulatory Visit: Payer: Self-pay | Admitting: Family Medicine

## 2020-11-22 ENCOUNTER — Encounter: Payer: Self-pay | Admitting: Family Medicine

## 2020-11-22 DIAGNOSIS — G4700 Insomnia, unspecified: Secondary | ICD-10-CM

## 2020-11-22 NOTE — Telephone Encounter (Signed)
Ok to place an order for colonoscopy?

## 2020-11-26 ENCOUNTER — Other Ambulatory Visit: Payer: Self-pay | Admitting: *Deleted

## 2020-11-26 DIAGNOSIS — Z1211 Encounter for screening for malignant neoplasm of colon: Secondary | ICD-10-CM

## 2020-11-26 NOTE — Telephone Encounter (Signed)
Referral placed.

## 2020-11-30 LAB — CBC WITH DIFFERENTIAL/PLATELET
Basophils Absolute: 0.1 10*3/uL (ref 0.0–0.2)
Basos: 1 %
EOS (ABSOLUTE): 0.1 10*3/uL (ref 0.0–0.4)
Eos: 1 %
Hematocrit: 50.5 % (ref 37.5–51.0)
Hemoglobin: 16.5 g/dL (ref 13.0–17.7)
Immature Grans (Abs): 0 10*3/uL (ref 0.0–0.1)
Immature Granulocytes: 0 %
Lymphocytes Absolute: 2.5 10*3/uL (ref 0.7–3.1)
Lymphs: 40 %
MCH: 30.5 pg (ref 26.6–33.0)
MCHC: 32.7 g/dL (ref 31.5–35.7)
MCV: 93 fL (ref 79–97)
Monocytes Absolute: 0.6 10*3/uL (ref 0.1–0.9)
Monocytes: 9 %
Neutrophils Absolute: 3 10*3/uL (ref 1.4–7.0)
Neutrophils: 49 %
Platelets: 194 10*3/uL (ref 150–450)
RBC: 5.41 x10E6/uL (ref 4.14–5.80)
RDW: 12.9 % (ref 11.6–15.4)
WBC: 6.3 10*3/uL (ref 3.4–10.8)

## 2020-11-30 LAB — BASIC METABOLIC PANEL
BUN/Creatinine Ratio: 19 (ref 10–24)
BUN: 23 mg/dL (ref 8–27)
CO2: 20 mmol/L (ref 20–29)
Calcium: 10.3 mg/dL — ABNORMAL HIGH (ref 8.6–10.2)
Chloride: 106 mmol/L (ref 96–106)
Creatinine, Ser: 1.18 mg/dL (ref 0.76–1.27)
Glucose: 95 mg/dL (ref 65–99)
Potassium: 4.3 mmol/L (ref 3.5–5.2)
Sodium: 143 mmol/L (ref 134–144)
eGFR: 67 mL/min/{1.73_m2} (ref >59)

## 2020-11-30 LAB — ROCKY MTN SPOTTED FVR ABS PNL(IGG+IGM)
RMSF IgG: NEGATIVE
RMSF IgM: 0.47 index (ref 0.00–0.89)

## 2020-11-30 LAB — LYME DISEASE SEROLOGY W/REFLEX: Lyme Total Antibody EIA: NEGATIVE

## 2020-12-12 ENCOUNTER — Ambulatory Visit (INDEPENDENT_AMBULATORY_CARE_PROVIDER_SITE_OTHER): Payer: Medicare PPO

## 2020-12-12 DIAGNOSIS — Z0000 Encounter for general adult medical examination without abnormal findings: Secondary | ICD-10-CM | POA: Diagnosis not present

## 2020-12-12 MED ORDER — LORAZEPAM 0.5 MG PO TABS: 0.5000 mg | ORAL_TABLET | Freq: Every day | ORAL | 5 refills | Status: DC

## 2020-12-12 MED ORDER — ZOLPIDEM TARTRATE ER 6.25 MG PO TBCR: 6.2500 mg | EXTENDED_RELEASE_TABLET | Freq: Every evening | ORAL | 5 refills | Status: DC | PRN

## 2020-12-12 NOTE — Progress Notes (Signed)
Virtual Visit via Telephone Note  I connected with  Riggin Cuttino on 12/12/20 at  9:30 AM EDT by telephone and verified that I am speaking with the correct person using two identifiers.  Medicare Annual Wellness visit completed telephonically due to Covid-19 pandemic.   Persons participating in this call: This Health Coach and this patient.   Location: Patient: Home Provider: Office   I discussed the limitations, risks, security and privacy concerns of performing an evaluation and management service by telephone and the availability of in person appointments. The patient expressed understanding and agreed to proceed.  Unable to perform video visit due to video visit attempted and failed and/or patient does not have video capability.   Some vital signs may be absent or patient reported.   Willette Brace, LPN   Subjective:   Jehu Mccauslin is a 70 y.o. male who presents for Medicare Annual/Subsequent preventive examination.  Review of Systems     Cardiac Risk Factors include: advanced age (>19men, >96 women);dyslipidemia;male gender     Objective:    There were no vitals filed for this visit. There is no height or weight on file to calculate BMI.  Advanced Directives 12/12/2020 12/07/2019 10/30/2015  Does Patient Have a Medical Advance Directive? No No No  Would patient like information on creating a medical advance directive? Yes (MAU/Ambulatory/Procedural Areas - Information given) Yes (MAU/Ambulatory/Procedural Areas - Information given) No - patient declined information    Current Medications (verified) Outpatient Encounter Medications as of 12/12/2020  Medication Sig   acyclovir (ZOVIRAX) 200 MG capsule TAKE 1 CAPSULE BY MOUTH 5 TIMES A DAY AS SOON AS SYPTOMS START AND CONTINUE FOR 5 DAYS   Bioflavonoid Products (VITAMIN C PLUS PO) Take 400 mg by mouth.   LORazepam (ATIVAN) 0.5 MG tablet TAKE 1 TABLET BY MOUTH AT BEDTIME.   Melatonin 10 MG CAPS Take by mouth.   Multiple  Vitamin (MULTI-DAY VITAMINS PO) Take by mouth as needed.   omeprazole (PRILOSEC) 20 MG capsule TAKE 1 CAPSULE BY MOUTH EVERY DAY   tadalafil (CIALIS) 20 MG tablet Take 1 tablet (20 mg total) by mouth daily as needed.   zolpidem (AMBIEN CR) 6.25 MG CR tablet TAKE 1 TABLET BY MOUTH AT BEDTIME AS NEEDED FOR SLEEP.   aspirin 81 MG tablet Take 81 mg by mouth daily. (Patient not taking: Reported on 12/12/2020)   [DISCONTINUED] diphenhydramine-acetaminophen (TYLENOL PM) 25-500 MG TABS tablet Take 1 tablet by mouth at bedtime as needed. (Patient not taking: Reported on 12/12/2020)   No facility-administered encounter medications on file as of 12/12/2020.    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   Past Surgical History:  Procedure Laterality Date   COLONOSCOPY     MELANOMA EXCISION     left cheek/face   NOSE SURGERY     Deviated Septum   Family History  Problem Relation Age of Onset   Colon cancer Mother    Social History   Socioeconomic History   Marital status: Married    Spouse name: Not on file   Number of children: Not on file   Years of education: Not on file   Highest education level: Not on file  Occupational History   Occupation: Retired  Tobacco Use   Smoking status: Never   Smokeless tobacco: Never  Vaping Use   Vaping Use: Never used  Substance and Sexual Activity   Alcohol use: Yes    Alcohol/week: 3.0 standard drinks    Types: 3 Cans of beer per week    Comment: mixture of beer, wine, liquor   Drug use: No   Sexual activity: Not on file  Other Topics Concern   Not on file  Social History Narrative   Single   Gets regular exercise   Social Determinants of Health   Financial Resource Strain: Low  Risk    Difficulty of Paying Living Expenses: Not hard at all  Food Insecurity: No Food Insecurity   Worried About Charity fundraiser in the Last Year: Never true   Ran Out of Food in the Last Year: Never true  Transportation Needs: No Transportation Needs   Lack of Transportation (Medical): No   Lack of Transportation (Non-Medical): No  Physical Activity: Sufficiently Active   Days of Exercise per Week: 5 days   Minutes of Exercise per Session: 120 min  Stress: No Stress Concern Present   Feeling of Stress : Not at all  Social Connections: Moderately Integrated   Frequency of Communication with Friends and Family: More than three times a week   Frequency of Social Gatherings with Friends and Family: More than three times a week   Attends Religious Services: 1 to 4 times per year   Active Member of [employer] or Organizations: No   Attends Music therapist: Never   Marital Status: Married    Tobacco Counseling Counseling given: Not Answered   Clinical Intake:  Pre-visit preparation completed: Yes  Pain : No/denies pain     BMI - recorded: 24.81 Nutritional Status: BMI of 19-24  Normal Nutritional Risks: None Diabetes: No  How often do you need to have someone help you when you read instructions, pamphlets, or other written materials from your doctor or pharmacy?: 1 - Never  Diabetic?No  Interpreter Needed?: No  Information entered by :: Charlott Rakes, LPN   Activities of Daily Living In your present state of health, do you have any difficulty performing the following activities: 12/12/2020  Hearing? Y  Comment tinnitus and mild loss in crowds  Vision? N  Difficulty concentrating or making decisions? N  Walking or climbing stairs? N  Dressing or bathing? N  Doing errands, shopping? N  Preparing Food and eating ? N  Using the Toilet? N  In the past six months, have you accidently leaked urine? N  Do you have problems with loss of bowel control? N   Managing your Medications? N  Managing your Finances? N  Housekeeping or managing your Housekeeping? N  Some recent data might be hidden    Patient Care Team: Vivi Barrack, MD as PCP - General (Family Medicine)  Indicate any recent Medical Services you may have received from other than [facility] providers in the past year (date may be approximate).     Assessment:   This is a routine wellness examination for Coda.  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  Vision Screening - Comments:: Pt follows up with Dr Jerilynn Mages in [place] for annual eye exams   Dietary issues and exercise activities discussed: Current Exercise Habits: Home exercise routine, Type of exercise: walking;Other - see comments (pickle ball and yard work), Time (Minutes): > 60, Frequency (Times/Week): 5, Weekly Exercise (Minutes/Week):  0   Goals Addressed             This Visit's Progress    Patient Stated       Stay active         Depression Screen PHQ 2/9 Scores 12/12/2020 12/07/2019 08/25/2019 03/24/2019 11/28/2018 11/10/2017 11/03/2016  PHQ - 2 Score 0 0 0 0 0 0 0  PHQ- 9 Score - - - 0 - - -    Fall Risk Fall Risk  12/12/2020 12/07/2019 11/28/2018 11/10/2017 11/03/2016  Falls in the past year? 0 0 0 No No  Number falls in past yr: 0 0 0 - -  Injury with Fall? 0 0 0 - -  Risk for fall due to : - No Fall Risks - - -  Follow up Falls prevention discussed Falls prevention discussed Education provided - -    FALL RISK PREVENTION PERTAINING TO THE HOME:  Any stairs in or around the home? Yes  If so, are there any without handrails? No  Home free of loose throw rugs in walkways, pet beds, electrical cords, etc? Yes  Adequate lighting in your home to reduce risk of falls? Yes   ASSISTIVE DEVICES UTILIZED TO PREVENT FALLS:  Life alert? No  Use of a cane, walker or w/c? No  Grab bars in the bathroom? No  Shower chair or bench in shower? Yes  Elevated toilet seat or a  handicapped toilet? No   TIMED UP AND GO:  Was the test performed? No     Cognitive Function:     6CIT Screen 12/12/2020 12/07/2019  What Year? 0 points -  What month? 0 points -  What time? 0 points -  Count back from 20 0 points 0 points  Months in reverse 0 points 0 points  Repeat phrase 0 points 0 points  Total Score 0 -    Immunizations Immunization History  Administered Date(s) Administered   Influenza Split 03/08/2012   Influenza, High Dose Seasonal PF 04/21/2017, 04/25/2018, 02/29/2020   Influenza,inj,Quad PF,6+ Mos 02/16/2019   PFIZER Comirnaty(Gray Top)Covid-19 Tri-Sucrose Vaccine 09/16/2020   PFIZER(Purple Top)SARS-COV-2 Vaccination 06/28/2019, 07/17/2019   Pneumococcal Conjugate-13 11/10/2017   Pneumococcal Polysaccharide-23 03/24/2019   Td 02/06/1998, 12/28/2008   Zoster, Live 10/05/2015    TDAP status: Due, Education has been provided regarding the importance of this vaccine. Advised may receive this vaccine at local pharmacy or Health Dept. Aware to provide a copy of the vaccination record if obtained from local pharmacy or Health Dept. Verbalized acceptance and understanding.  Flu Vaccine status: Up to date  Pneumococcal vaccine status: Up to date  Covid-19 vaccine status: Completed vaccines  Qualifies for Shingles Vaccine? Yes   Zostavax completed Yes   Shingrix Completed?: No.    Education has been provided regarding the importance of this vaccine. Patient has been advised to call insurance company to determine out of pocket expense if they have not yet received this vaccine. Advised may also receive vaccine at local pharmacy or Health Dept. Verbalized acceptance and understanding.  Screening Tests Health Maintenance  Topic Date Due   Zoster Vaccines- Shingrix (1 of 2) Never done   TETANUS/TDAP  12/29/2018   COLONOSCOPY (Pts 45-82yrs Insurance coverage will need to be confirmed)  11/14/2020   INFLUENZA VACCINE  01/06/2021   COVID-19 Vaccine (4 -  Booster for Pfizer series) 01/16/2021   Hepatitis C Screening  Completed   PNA vac Low Risk Adult  Completed   HPV VACCINES  Aged Out  Health Maintenance  Health Maintenance Due  Topic Date Due   Zoster Vaccines- Shingrix (1 of 2) Never done   TETANUS/TDAP  12/29/2018   COLONOSCOPY (Pts 45-33yrs Insurance coverage will need to be confirmed)  11/14/2020    Colorectal cancer screening: Referral to GI placed 7/7/2. Pt aware the office will call re: appt.There is already a pending order for this procedure    Additional Screening:  Hepatitis C Screening:  Completed 11/10/17  Vision Screening: Recommended annual ophthalmology exams for early detection of glaucoma and other disorders of the eye. Is the patient up to date with their annual eye exam?  Yes  Who is the provider or what is the name of the office in which the patient attends annual eye exams? Dr Jerilynn Mages in [place] If pt is not established with a provider, would they like to be referred to a provider to establish care? No .   Dental Screening: Recommended annual dental exams for proper oral hygiene  Community Resource Referral / Chronic Care Management: CRR required this visit?  No   CCM required this visit?  No      Plan:     I have personally reviewed and noted the following in the patient's chart:   Medical and social history Use of alcohol, tobacco or illicit drugs  Current medications and supplements including opioid prescriptions. Patient is not currently taking opioid prescriptions. Functional ability and status Nutritional status Physical activity Advanced directives List of other physicians Hospitalizations, surgeries, and ER visits in previous 12 months Vitals Screenings to include cognitive, depression, and falls Referrals and appointments  In addition, I have reviewed and discussed with patient certain preventive protocols, quality metrics, and best practice recommendations. A written personalized care  plan for preventive services as well as general preventive health recommendations were provided to patient.     Willette Brace, LPN   02/12/2835   Nurse Notes: Note sent through message

## 2020-12-12 NOTE — Telephone Encounter (Signed)
-----   Message from Willette Brace, LPN sent at 02/09/7653  9:54 AM EDT ----- Regarding: refill and discontinuing med Spoke with Pt during AWV he stated that he needed a refill for Ambien CR 6.25 and Ativan 0.5 related to he will be going to [place] on the 14th and will run out. RX is [facility] [phone]. He also stated if he should continue taking Asprin 81 mg related to reports on TV, please advise

## 2020-12-12 NOTE — Patient Instructions (Signed)
Robert Cowan , Thank you for taking time to come for your Medicare Wellness Visit. I appreciate your ongoing commitment to your health goals. Please review the following plan we discussed and let me know if I can assist you in the future.   Screening recommendations/referrals: Colonoscopy: Done 11/15/15 there is a pending order placed for this procedure  Recommended yearly ophthalmology/optometry visit for glaucoma screening and checkup Recommended yearly dental visit for hygiene and checkup  Vaccinations: Influenza vaccine: Due 01/06/21 Pneumococcal vaccine: Completed  Tdap vaccine: Due and discussed Shingles vaccine: Shingrix discussed. Please contact your pharmacy for coverage information.    Covid-19: Completed 1/20, 2/8, 03/28/20 & 09/16/20  Advanced directives: Advance directive discussed with you today. I have provided a copy for you to complete at home and have notarized. Once this is complete please bring a copy in to our office so we can scan it into your chart.  Conditions/risks identified: Stay active and maintain exercise   Next appointment: Follow up in one year for your annual wellness visit.   Preventive Care 30 Years and Older, Male Preventive care refers to lifestyle choices and visits with your health care provider that can promote health and wellness. What does preventive care include? A yearly physical exam. This is also called an annual well check. Dental exams once or twice a year. Routine eye exams. Ask your health care provider how often you should have your eyes checked. Personal lifestyle choices, including: Daily care of your teeth and gums. Regular physical activity. Eating a healthy diet. Avoiding tobacco and drug use. Limiting alcohol use. Practicing safe sex. Taking low doses of aspirin every day. Taking vitamin and mineral supplements as recommended by your health care provider. What happens during an annual well check? The services and screenings done by  your health care provider during your annual well check will depend on your age, overall health, lifestyle risk factors, and family history of disease. Counseling  Your health care provider may ask you questions about your: Alcohol use. Tobacco use. Drug use. Emotional well-being. Home and relationship well-being. Sexual activity. Eating habits. History of falls. Memory and ability to understand (cognition). Work and work Statistician. Screening  You may have the following tests or measurements: Height, weight, and BMI. Blood pressure. Lipid and cholesterol levels. These may be checked every 5 years, or more frequently if you are over 70 years old. Skin check. Lung cancer screening. You may have this screening every year starting at age 91 if you have a 30-pack-year history of smoking and currently smoke or have quit within the past 15 years. Fecal occult blood test (FOBT) of the stool. You may have this test every year starting at age 6. Flexible sigmoidoscopy or colonoscopy. You may have a sigmoidoscopy every 5 years or a colonoscopy every 10 years starting at age 7. Prostate cancer screening. Recommendations will vary depending on your family history and other risks. Hepatitis C blood test. Hepatitis B blood test. Sexually transmitted disease (STD) testing. Diabetes screening. This is done by checking your blood sugar (glucose) after you have not eaten for a while (fasting). You may have this done every 1-3 years. Abdominal aortic aneurysm (AAA) screening. You may need this if you are a current or former smoker. Osteoporosis. You may be screened starting at age 74 if you are at high risk. Talk with your health care provider about your test results, treatment options, and if necessary, the need for more tests. Vaccines  Your health care provider may  recommend certain vaccines, such as: Influenza vaccine. This is recommended every year. Tetanus, diphtheria, and acellular pertussis  (Tdap, Td) vaccine. You may need a Td booster every 10 years. Zoster vaccine. You may need this after age 37. Pneumococcal 13-valent conjugate (PCV13) vaccine. One dose is recommended after age 59. Pneumococcal polysaccharide (PPSV23) vaccine. One dose is recommended after age 23. Talk to your health care provider about which screenings and vaccines you need and how often you need them. This information is not intended to replace advice given to you by your health care provider. Make sure you discuss any questions you have with your health care provider. Document Released: 06/21/2015 Document Revised: 02/12/2016 Document Reviewed: 03/26/2015 Elsevier Interactive Patient Education  2017 [place] Prevention in the Home Falls can cause injuries. They can happen to people of all ages. There are many things you can do to make your home safe and to help prevent falls. What can I do on the outside of my home? Regularly fix the edges of walkways and driveways and fix any cracks. Remove anything that might make you trip as you walk through a door, such as a raised step or threshold. Trim any bushes or trees on the path to your home. Use bright outdoor lighting. Clear any walking paths of anything that might make someone trip, such as rocks or tools. Regularly check to see if handrails are loose or broken. Make sure that both sides of any steps have handrails. Any raised decks and porches should have guardrails on the edges. Have any leaves, snow, or ice cleared regularly. Use sand or salt on walking paths during winter. Clean up any spills in your garage right away. This includes oil or grease spills. What can I do in the bathroom? Use night lights. Install grab bars by the toilet and in the tub and shower. Do not use towel bars as grab bars. Use non-skid mats or decals in the tub or shower. If you need to sit down in the shower, use a plastic, non-slip stool. Keep the floor dry. Clean  up any water that spills on the floor as soon as it happens. Remove soap buildup in the tub or shower regularly. Attach bath mats securely with double-sided non-slip rug tape. Do not have throw rugs and other things on the floor that can make you trip. What can I do in the bedroom? Use night lights. Make sure that you have a light by your bed that is easy to reach. Do not use any sheets or blankets that are too big for your bed. They should not hang down onto the floor. Have a firm chair that has side arms. You can use this for support while you get dressed. Do not have throw rugs and other things on the floor that can make you trip. What can I do in the kitchen? Clean up any spills right away. Avoid walking on wet floors. Keep items that you use a lot in easy-to-reach places. If you need to reach something above you, use a strong step stool that has a grab bar. Keep electrical cords out of the way. Do not use floor polish or wax that makes floors slippery. If you must use wax, use non-skid floor wax. Do not have throw rugs and other things on the floor that can make you trip. What can I do with my stairs? Do not leave any items on the stairs. Make sure that there are handrails on both sides of  the stairs and use them. Fix handrails that are broken or loose. Make sure that handrails are as long as the stairways. Check any carpeting to make sure that it is firmly attached to the stairs. Fix any carpet that is loose or worn. Avoid having throw rugs at the top or bottom of the stairs. If you do have throw rugs, attach them to the floor with carpet tape. Make sure that you have a light switch at the top of the stairs and the bottom of the stairs. If you do not have them, ask someone to add them for you. What else can I do to help prevent falls? Wear shoes that: Do not have high heels. Have rubber bottoms. Are comfortable and fit you well. Are closed at the toe. Do not wear sandals. If you  use a stepladder: Make sure that it is fully opened. Do not climb a closed stepladder. Make sure that both sides of the stepladder are locked into place. Ask someone to hold it for you, if possible. Clearly mark and make sure that you can see: Any grab bars or handrails. First and last steps. Where the edge of each step is. Use tools that help you move around (mobility aids) if they are needed. These include: Canes. Walkers. Scooters. Crutches. Turn on the lights when you go into a dark area. Replace any light bulbs as soon as they burn out. Set up your furniture so you have a clear path. Avoid moving your furniture around. If any of your floors are uneven, fix them. If there are any pets around you, be aware of where they are. Review your medicines with your doctor. Some medicines can make you feel dizzy. This can increase your chance of falling. Ask your doctor what other things that you can do to help prevent falls. This information is not intended to replace advice given to you by your health care provider. Make sure you discuss any questions you have with your health care provider. Document Released: 03/21/2009 Document Revised: 10/31/2015 Document Reviewed: 06/29/2014 Elsevier Interactive Patient Education  2017 [employer].

## 2020-12-19 DIAGNOSIS — Z20822 Contact with and (suspected) exposure to covid-19: Secondary | ICD-10-CM | POA: Diagnosis not present

## 2021-01-13 DIAGNOSIS — L245 Irritant contact dermatitis due to other chemical products: Secondary | ICD-10-CM | POA: Diagnosis not present

## 2021-01-21 DIAGNOSIS — S61412A Laceration without foreign body of left hand, initial encounter: Secondary | ICD-10-CM | POA: Diagnosis not present

## 2021-01-27 ENCOUNTER — Encounter: Payer: Self-pay | Admitting: Family Medicine

## 2021-02-08 ENCOUNTER — Other Ambulatory Visit: Payer: Self-pay | Admitting: Family Medicine

## 2021-03-05 ENCOUNTER — Encounter: Payer: Self-pay | Admitting: Gastroenterology

## 2021-03-24 DIAGNOSIS — L818 Other specified disorders of pigmentation: Secondary | ICD-10-CM | POA: Diagnosis not present

## 2021-03-24 DIAGNOSIS — L821 Other seborrheic keratosis: Secondary | ICD-10-CM | POA: Diagnosis not present

## 2021-03-24 DIAGNOSIS — D485 Neoplasm of uncertain behavior of skin: Secondary | ICD-10-CM | POA: Diagnosis not present

## 2021-03-24 DIAGNOSIS — I788 Other diseases of capillaries: Secondary | ICD-10-CM | POA: Diagnosis not present

## 2021-03-24 DIAGNOSIS — L812 Freckles: Secondary | ICD-10-CM | POA: Diagnosis not present

## 2021-03-24 DIAGNOSIS — L814 Other melanin hyperpigmentation: Secondary | ICD-10-CM | POA: Diagnosis not present

## 2021-03-24 DIAGNOSIS — D1801 Hemangioma of skin and subcutaneous tissue: Secondary | ICD-10-CM | POA: Diagnosis not present

## 2021-03-24 DIAGNOSIS — L570 Actinic keratosis: Secondary | ICD-10-CM | POA: Diagnosis not present

## 2021-03-27 ENCOUNTER — Encounter: Payer: Self-pay | Admitting: Gastroenterology

## 2021-03-27 ENCOUNTER — Encounter: Payer: Self-pay | Admitting: Family Medicine

## 2021-03-27 ENCOUNTER — Ambulatory Visit (INDEPENDENT_AMBULATORY_CARE_PROVIDER_SITE_OTHER): Payer: Medicare PPO | Admitting: Family Medicine

## 2021-03-27 ENCOUNTER — Other Ambulatory Visit: Payer: Self-pay

## 2021-03-27 VITALS — BP 133/85 | HR 70 | Temp 98.0°F | Ht 69.0 in | Wt 166.4 lb

## 2021-03-27 DIAGNOSIS — M7712 Lateral epicondylitis, left elbow: Secondary | ICD-10-CM

## 2021-03-27 DIAGNOSIS — N529 Male erectile dysfunction, unspecified: Secondary | ICD-10-CM | POA: Diagnosis not present

## 2021-03-27 DIAGNOSIS — E785 Hyperlipidemia, unspecified: Secondary | ICD-10-CM | POA: Diagnosis not present

## 2021-03-27 DIAGNOSIS — R739 Hyperglycemia, unspecified: Secondary | ICD-10-CM | POA: Diagnosis not present

## 2021-03-27 DIAGNOSIS — G4700 Insomnia, unspecified: Secondary | ICD-10-CM

## 2021-03-27 DIAGNOSIS — Z125 Encounter for screening for malignant neoplasm of prostate: Secondary | ICD-10-CM

## 2021-03-27 DIAGNOSIS — Z1211 Encounter for screening for malignant neoplasm of colon: Secondary | ICD-10-CM | POA: Diagnosis not present

## 2021-03-27 DIAGNOSIS — Z0001 Encounter for general adult medical examination with abnormal findings: Secondary | ICD-10-CM

## 2021-03-27 DIAGNOSIS — M25511 Pain in right shoulder: Secondary | ICD-10-CM

## 2021-03-27 LAB — CBC
HCT: 48.4 % (ref 39.0–52.0)
Hemoglobin: 16.1 g/dL (ref 13.0–17.0)
MCHC: 33.3 g/dL (ref 30.0–36.0)
MCV: 93 fl (ref 78.0–100.0)
Platelets: 188 10*3/uL (ref 150.0–400.0)
RBC: 5.2 Mil/uL (ref 4.22–5.81)
RDW: 13.4 % (ref 11.5–15.5)
WBC: 4.8 10*3/uL (ref 4.0–10.5)

## 2021-03-27 LAB — COMPREHENSIVE METABOLIC PANEL
ALT: 14 U/L (ref 0–53)
AST: 24 U/L (ref 0–37)
Albumin: 4.8 g/dL (ref 3.5–5.2)
Alkaline Phosphatase: 46 U/L (ref 39–117)
BUN: 28 mg/dL — ABNORMAL HIGH (ref 6–23)
CO2: 27 mEq/L (ref 19–32)
Calcium: 10.3 mg/dL (ref 8.4–10.5)
Chloride: 105 mEq/L (ref 96–112)
Creatinine, Ser: 1.07 mg/dL (ref 0.40–1.50)
GFR: 70.72 mL/min (ref >60.00)
Glucose, Bld: 100 mg/dL — ABNORMAL HIGH (ref 70–99)
Potassium: 4.3 mEq/L (ref 3.5–5.1)
Sodium: 140 mEq/L (ref 135–145)
Total Bilirubin: 1.5 mg/dL — ABNORMAL HIGH (ref 0.2–1.2)
Total Protein: 7.1 g/dL (ref 6.0–8.3)

## 2021-03-27 LAB — HEMOGLOBIN A1C: Hgb A1c MFr Bld: 5.6 % (ref 4.6–6.5)

## 2021-03-27 LAB — TSH: TSH: 2.36 u[IU]/mL (ref 0.35–5.50)

## 2021-03-27 LAB — PSA: PSA: 1.4 ng/mL (ref 0.10–4.00)

## 2021-03-27 MED ORDER — TADALAFIL 20 MG PO TABS: 20.0000 mg | ORAL_TABLET | Freq: Every day | ORAL | 3 refills | Status: DC | PRN

## 2021-03-27 NOTE — Assessment & Plan Note (Signed)
Cialis refilled

## 2021-03-27 NOTE — Assessment & Plan Note (Signed)
Check A1c. 

## 2021-03-27 NOTE — Progress Notes (Signed)
Chief Complaint:  Robert Cowan is a 70 y.o. male who presents today for his annual comprehensive physical exam.    Assessment/Plan:  New/Acute Problems: Lateral epicondylitis Steroid injection performed today.  See below procedure.  He tolerated well.  Discussed home exercises and handout was given  Right shoulder pain Steroid injection performed today.  See below procedure note.  He tolerated well.  Discussed home exercises and handout was given.  Chronic Problems Addressed Today: Dyslipidemia Check labs.   Hyperglycemia Check A1c.   ED (erectile dysfunction) Cialis refilled.   Insomnia Stable. Continue ativan.   Preventative Healthcare: UTD on flu vaccine. Schedule for colonoscopy. Will get blood work done today. Check PSA.  Patient Counseling(The following topics were reviewed and/or handout was given):  -Nutrition: Stressed importance of moderation in sodium/caffeine intake, saturated fat and cholesterol, caloric balance, sufficient intake of fresh fruits, vegetables, and fiber.  -Stressed the importance of regular exercise.   -Substance Abuse: Discussed cessation/primary prevention of tobacco, alcohol, or other drug use; driving or other dangerous activities under the influence; availability of treatment for abuse.   -Injury prevention: Discussed safety belts, safety helmets, smoke detector, smoking near bedding or upholstery.   -Sexuality: Discussed sexually transmitted diseases, partner selection, use of condoms, avoidance of unintended pregnancy and contraceptive alternatives.   -Dental health: Discussed importance of regular tooth brushing, flossing, and dental visits.  -Health maintenance and immunizations reviewed. Please refer to Health maintenance section.  Return to care in 1 year for next preventative visit.     Subjective:  HPI:  He has no acute complaints today.   He has had issue with tennis elbow and had flare up few months ago. He tried cortisone  shot which seems to be helping in the past.   He also complain of shoulder pain.  He notes pain is worse with certain movements. Worse when lifting arm up. He would like to get injection for this issue. He has had rotator cuff issues in the past.    Lifestyle Diet: Trying to eat healthy. Exercise: Walking.  Depression screen PHQ 2/9 03/27/2021  Decreased Interest 0  Down, Depressed, Hopeless 0  PHQ - 2 Score 0  Altered sleeping -  Change in appetite -  Feeling bad or failure about yourself  -  Trouble concentrating -  Moving slowly or fidgety/restless -  Suicidal thoughts -  PHQ-9 Score -  Difficult doing work/chores -    Health Maintenance Due  Topic Date Due   Zoster Vaccines- Shingrix (1 of 2) Never done   COVID-19 Vaccine (4 - Booster for Pfizer series) 11/11/2020   COLONOSCOPY (Pts 45-105yrs Insurance coverage will need to be confirmed)  11/14/2020     ROS: Per HPI, otherwise a complete review of systems was negative.   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     Bioflavonoid Products (VITAMIN C PLUS PO) Take 400 mg by mouth.     LORazepam (ATIVAN) 0.5 MG tablet Take 1 tablet (0.5 mg total) by mouth at bedtime. 30 tablet 5   Melatonin 10 MG CAPS Take by mouth.     Multiple Vitamin (MULTI-DAY VITAMINS PO) Take by mouth as needed.     omeprazole (PRILOSEC) 20 MG capsule TAKE 1 CAPSULE BY MOUTH EVERY DAY 90 capsule 3   zolpidem (AMBIEN CR) 6.25 MG CR tablet Take 1 tablet (6.25 mg total) by mouth at bedtime as needed. for sleep 30 tablet 5   tadalafil (CIALIS) 20 MG tablet Take 1 tablet (20 mg total) by mouth daily as needed. 90 tablet 3   No current facility-administered medications for this visit.    Allergies-reviewed and updated Allergies  Allergen Reactions   Tetracycline Hcl     REACTION: blister on genitals    Social History   Socioeconomic History   Marital status: Married    Spouse name: Not on file   Number of children: Not on file   Years of education: Not on file   Highest education level: Not on file  Occupational History   Occupation: Retired  Tobacco Use   Smoking status: Never   Smokeless tobacco: Never  Vaping Use   Vaping Use: Never used  Substance and Sexual Activity   Alcohol use: Yes    Alcohol/week: 3.0 standard drinks    Types: 3 Cans of beer per week    Comment: mixture of beer, wine, liquor   Drug use: No   Sexual activity: Not on file  Other Topics Concern   Not on file  Social History Narrative   Single   Gets regular exercise   Social Determinants of Health   Financial Resource Strain: Low Risk    Difficulty of Paying Living Expenses: Not hard at all  Food Insecurity: No Food Insecurity   Worried About Charity fundraiser in the Last Year: Never true   Ran Out of Food in the Last Year: Never true  Transportation Needs: No Transportation Needs   Lack of  Transportation (Medical): No   Lack of Transportation (Non-Medical): No  Physical Activity: Sufficiently Active   Days of Exercise per Week: 5 days   Minutes of Exercise per Session: 120 min  Stress: No Stress Concern Present   Feeling of Stress : Not at all  Social Connections: Moderately Integrated   Frequency of Communication with Friends and Family: More than three times a week   Frequency of Social Gatherings with Friends and Family: More than three times a week   Attends Religious Services: 1 to 4 times per year   Active Member of [employer] or Organizations: No   Attends Archivist Meetings: Never   Marital Status: Married        Objective:  Physical Exam: BP 133/85   Pulse 70   Temp 98 F (36.7 C) (Temporal)   Ht 5'  9" (1.753 m)   Wt 166 lb 6.4 oz (75.5 kg)   SpO2 98%   BMI 24.57 kg/m   Body mass index is 24.57 kg/m. Wt Readings from Last 3 Encounters:  03/27/21 166 lb 6.4 oz (75.5 kg)  09/23/20 168 lb (76.2 kg)  03/25/20 161 lb 3.2 oz (73.1 kg)   Gen: NAD, resting comfortably.  All HEENT: TMs normal bilaterally. OP clear. No thyromegaly noted.  CV: RRR with no murmurs appreciated Pulm: NWOB, CTAB with no crackles, wheezes, or rhonchi GI: Normal bowel sounds present. Soft, Nontender, Nondistended. MSK: no edema, cyanosis, or clubbing noted. - Right Shoulder: Full range of motion.  Mild pain with resisted supraspinatus testing.  Positive Neer and Hawkins test.  Neurovascular intact distally - Left elbow, tenderness to palpation at left lateral epicondyle.  Pain elicited with resisted extension of third digit. Skin: warm, dry Neuro: CN2-12 grossly intact. Strength 5/5 in upper and lower extremities. Reflexes symmetric and intact bilaterally.  Psych: Normal affect and thought content  Shoulder Injection Procedure Note  Pre-operative Diagnosis: Rotator Cuff tendinopathy  Post-operative Diagnosis: same  Indications: Pain Relief   Anesthesia: Topical  ethyl chloride  Procedure Details   Verbal consent was obtained for the procedure. The shoulder was prepped with iodine and the skin was anesthetized with topical ethyl choride. Using a 25 gauge needle the subacromial space was injected with 3 mL 1% lidocaine and 1 mL of 80cc/ml depomedrol under the posterior aspect of the acromion. The injection site was cleansed with topical isopropyl alcohol and a dressing was applied.  Complications:  None; patient tolerated the procedure well.  Lateral Epicondylitis Procedure Note  Pre-operative Diagnosis: Lateral epicondylitis  Post-operative Diagnosis: same  Indications: Pain Relief   Anesthesia: Topical ethyl chloride  Procedure Details   Verbal consent was obtained for the procedure. The lateral epicondyle was prepped with iodine and the skin was anesthetized with topical ethyl choride. Using a 25 gauge needle point of most tenderness was injected with 1 mL 1% lidocaine and 1 mL of 40cc/ml depomedrol along the tendon sheathe. The injection site was cleansed with topical isopropyl alcohol and a dressing was applied.  Complications:  None; patient tolerated the procedure well.   Dimas Chyle, MD 03/27/2021 11:16 AM       Ila Mcgill Zaman,acting as a scribe for Dimas Chyle, MD.,have documented all relevant documentation on the behalf of Dimas Chyle, MD,as directed by  Dimas Chyle, MD while in the presence of Dimas Chyle, MD.   I, Dimas Chyle, MD, have reviewed all documentation for this visit. The documentation on 03/27/21 for the exam, diagnosis, procedures, and orders are all accurate and complete.  [name]. [name], MD 03/27/2021 11:16 AM

## 2021-03-27 NOTE — Assessment & Plan Note (Signed)
Check labs 

## 2021-03-27 NOTE — Assessment & Plan Note (Signed)
Stable. Continue ativan.

## 2021-03-27 NOTE — Patient Instructions (Signed)
It was very nice to see you today!  We injected your shoulder and elbow.  We will check blood work today.  Please continue working on diet and exercise.  Please work on the exercises, handouts.  We will see back in 6 months.  Please come back to see me sooner if needed.  Take care, Dr [name]  PLEASE NOTE:  If you had any lab tests please let us know if you have not heard back within a few days. You may see your results on mychart before we have a chance to review them but we will give you a call once they are reviewed by [place]. If we ordered any referrals today, please let us know if you have not heard from their office within the next week.   Please try these tips to maintain a healthy lifestyle:  Eat at least 3 REAL meals and 1-2 snacks per day.  Aim for no more than 5 hours between eating.  If you eat breakfast, please do so within one hour of getting up.   Each meal should contain half fruits/vegetables, one quarter protein, and one quarter carbs (no bigger than a computer mouse)  Cut down on sweet beverages. This includes juice, soda, and sweet tea.   Drink at least 1 glass of water with each meal and aim for at least 8 glasses per day  Exercise at least 150 minutes every week.    Preventive Care 75 Years and Older, Male Preventive care refers to lifestyle choices and visits with your health care provider that can promote health and wellness. This includes: A yearly physical exam. This is also called an annual wellness visit. Regular dental and eye exams. Immunizations. Screening for certain conditions. Healthy lifestyle choices, such as: Eating a healthy diet. Getting regular exercise. Not using drugs or products that contain nicotine and tobacco. Limiting alcohol use. What can I expect for my preventive care visit? Physical exam Your health care provider will check your: Height and weight. These may be used to calculate your BMI (body mass index). BMI is a measurement  that tells if you are at a healthy weight. Heart rate and blood pressure. Body temperature. Skin for abnormal spots. Counseling Your health care provider may ask you questions about your: Past medical problems. Family's medical history. Alcohol, tobacco, and drug use. Emotional well-being. Home life and relationship well-being. Sexual activity. Diet, exercise, and sleep habits. History of falls. Memory and ability to understand (cognition). Work and work Statistician. Access to firearms. What immunizations do I need? Vaccines are usually given at various ages, according to a schedule. Your health care provider will recommend vaccines for you based on your age, medical history, and lifestyle or other factors, such as travel or where you work. What tests do I need? Blood tests Lipid and cholesterol levels. These may be checked every 5 years, or more often depending on your overall health. Hepatitis C test. Hepatitis B test. Screening Lung cancer screening. You may have this screening every year starting at age 70 if you have a 30-pack-year history of smoking and currently smoke or have quit within the past 15 years. Colorectal cancer screening. All adults should have this screening starting at age 48 and continuing until age 29. Your health care provider may recommend screening at age 84 if you are at increased risk. You will have tests every 1-10 years, depending on your results and the type of screening test. Prostate cancer screening. Recommendations will vary depending on your  family history and other risks. Genital exam to check for testicular cancer or hernias. Diabetes screening. This is done by checking your blood sugar (glucose) after you have not eaten for a while (fasting). You may have this done every 1-3 years. Abdominal aortic aneurysm (AAA) screening. You may need this if you are a current or former smoker. STD (sexually transmitted disease) testing, if you are at  risk. Follow these instructions at home: Eating and drinking  Eat a diet that includes fresh fruits and vegetables, whole grains, lean protein, and low-fat dairy products. Limit your intake of foods with high amounts of sugar, saturated fats, and salt. Take vitamin and mineral supplements as recommended by your health care provider. Do not drink alcohol if your health care provider tells you not to drink. If you drink alcohol: Limit how much you have to 0-2 drinks a day. Be aware of how much alcohol is in your drink. In the [place] one drink equals one 12 oz bottle of beer (355 mL), one 5 oz glass of wine (148 mL), or one 1 oz glass of hard liquor (44 mL). Lifestyle Take daily care of your teeth and gums. Brush your teeth every morning and night with fluoride toothpaste. Floss one time each day. Stay active. Exercise for at least 30 minutes 5 or more days each week. Do not use any products that contain nicotine or tobacco, such as cigarettes, e-cigarettes, and chewing tobacco. If you need help quitting, ask your health care provider. Do not use drugs. If you are sexually active, practice safe sex. Use a condom or other form of protection to prevent STIs (sexually transmitted infections). Talk with your health care provider about taking a low-dose aspirin or statin. Find healthy ways to cope with stress, such as: Meditation, yoga, or listening to music. Journaling. Talking to a trusted person. Spending time with friends and family. Safety Always wear your seat belt while driving or riding in a vehicle. Do not drive: If you have been drinking alcohol. Do not ride with someone who has been drinking. When you are tired or distracted. While texting. Wear a helmet and other protective equipment during sports activities. If you have firearms in your house, make sure you follow all gun safety procedures. What's next? Visit your health care provider once a year for an annual wellness visit. Ask  your health care provider how often you should have your eyes and teeth checked. Stay up to date on all vaccines. This information is not intended to replace advice given to you by your health care provider. Make sure you discuss any questions you have with your health care provider. Document Revised: 08/02/2020 Document Reviewed: 05/19/2018 Elsevier Patient Education  2022 [employer].

## 2021-03-28 NOTE — Progress Notes (Signed)
Please inform patient of the following:  Labs are all STABLE. Would like for him to keep up the good work and we can recheck in a year or so.  Robert Cowan. [name], MD 03/28/2021 8:05 AM

## 2021-04-07 ENCOUNTER — Other Ambulatory Visit: Payer: Self-pay | Admitting: Family Medicine

## 2021-05-07 MED ORDER — METHYLPREDNISOLONE ACETATE 80 MG/ML IJ SUSP
80.0000 mg | Freq: Once | INTRAMUSCULAR | Status: AC
  Administered 2021-03-27: 80 mg via INTRA_ARTICULAR

## 2021-05-07 NOTE — Addendum Note (Signed)
Addended byHildred Alamin on: 05/07/2021 10:54 AM   Modules accepted: Orders

## 2021-05-09 ENCOUNTER — Encounter: Payer: Self-pay | Admitting: Family Medicine

## 2021-05-13 ENCOUNTER — Other Ambulatory Visit: Payer: Self-pay | Admitting: *Deleted

## 2021-05-13 MED ORDER — ACYCLOVIR 200 MG PO CAPS: ORAL_CAPSULE | ORAL | 0 refills | Status: DC

## 2021-05-14 ENCOUNTER — Ambulatory Visit (AMBULATORY_SURGERY_CENTER): Payer: Self-pay

## 2021-05-14 ENCOUNTER — Other Ambulatory Visit: Payer: Self-pay

## 2021-05-14 VITALS — Ht 69.0 in | Wt 166.4 lb

## 2021-05-14 DIAGNOSIS — Z800 Family history of malignant neoplasm of digestive organs: Secondary | ICD-10-CM

## 2021-05-14 MED ORDER — NA SULFATE-K SULFATE-MG SULF 17.5-3.13-1.6 GM/177ML PO SOLN: 1.0000 | Freq: Once | ORAL | 0 refills | Status: AC

## 2021-05-14 NOTE — Progress Notes (Signed)
Denies allergies to eggs or soy products. Denies complication of anesthesia or sedation. Denies use of weight loss medication. Denies use of O2.   Emmi instructions given for colonoscopy.  

## 2021-05-21 ENCOUNTER — Ambulatory Visit (AMBULATORY_SURGERY_CENTER): Payer: Medicare PPO | Admitting: Gastroenterology

## 2021-05-21 ENCOUNTER — Encounter: Payer: Self-pay | Admitting: Gastroenterology

## 2021-05-21 ENCOUNTER — Other Ambulatory Visit: Payer: Self-pay

## 2021-05-21 VITALS — BP 124/80 | HR 64 | Temp 96.2°F | Resp 14 | Ht 69.0 in | Wt 166.0 lb

## 2021-05-21 DIAGNOSIS — Z800 Family history of malignant neoplasm of digestive organs: Secondary | ICD-10-CM | POA: Diagnosis not present

## 2021-05-21 DIAGNOSIS — Z1211 Encounter for screening for malignant neoplasm of colon: Secondary | ICD-10-CM

## 2021-05-21 DIAGNOSIS — D122 Benign neoplasm of ascending colon: Secondary | ICD-10-CM

## 2021-05-21 MED ORDER — SODIUM CHLORIDE 0.9 % IV SOLN: 500.0000 mL | Freq: Once | INTRAVENOUS | Status: DC

## 2021-05-21 NOTE — Progress Notes (Signed)
PT taken to PACU. Monitors in place. VSS. Report given to RN. 

## 2021-05-21 NOTE — Progress Notes (Signed)
Called to room to assist during endoscopic procedure.  Patient ID and intended procedure confirmed with present staff. Received instructions for my participation in the procedure from the performing physician.  

## 2021-05-21 NOTE — Op Note (Signed)
[place] Patient Name: Robert Cowan Procedure Date: 05/21/2021 2:03 PM MRN: [id number] Endoscopist: [place] Loletha Carrow , MD Age: 70 Referring MD:  Date of Birth: 04-Mar-1951 Gender: Male Account #: [id number] Procedure:                Colonoscopy Indications:              Screening in patient at increased risk: Colorectal                            cancer in mother before age 66                           (no polyps 11/2015 or 11/2010) Medicines:                Monitored Anesthesia Care Procedure:                Pre-Anesthesia Assessment:                           - Prior to the procedure, a History and Physical                            was performed, and patient medications and                            allergies were reviewed. The patient's tolerance of                            previous anesthesia was also reviewed. The risks                            and benefits of the procedure and the sedation                            options and risks were discussed with the patient.                            All questions were answered, and informed consent                            was obtained. Prior Anticoagulants: The patient has                            taken no previous anticoagulant or antiplatelet                            agents. ASA Grade Assessment: II - A patient with                            mild systemic disease. After reviewing the risks                            and benefits, the patient was deemed in  satisfactory condition to undergo the procedure.                           After obtaining informed consent, the colonoscope                            was passed under direct vision. Throughout the                            procedure, the patient's blood pressure, pulse, and                            oxygen saturations were monitored continuously. The                            CF [id number] #[id number] was introduced through the anus                             and advanced to the the cecum, identified by                            appendiceal orifice and ileocecal valve. The                            colonoscopy was performed without difficulty. The                            patient tolerated the procedure well. The quality                            of the bowel preparation was good. The ileocecal                            valve, appendiceal orifice, and rectum were                            photographed. Scope In: 2:33:25 PM Scope Out: 2:44:37 PM Scope Withdrawal Time: 0 hours 8 minutes 41 seconds  Total Procedure Duration: 0 hours 11 minutes 12 seconds  Findings:                 The perianal and digital rectal examinations were                            normal.                           A 5 mm polyp was found in the ascending colon. The                            polyp was sessile. The polyp was removed with a                            cold snare. Resection and retrieval were complete.  Multiple diverticula were found in the left colon.                           The exam was otherwise without abnormality on                            direct and retroflexion views. Complications:            No immediate complications. Estimated Blood Loss:     Estimated blood loss was minimal. Impression:               - One 5 mm polyp in the ascending colon, removed                            with a cold snare. Resected and retrieved.                           - Diverticulosis in the left colon.                           - The examination was otherwise normal on direct                            and retroflexion views. Recommendation:           - Patient has a contact number available for                            emergencies. The signs and symptoms of potential                            delayed complications were discussed with the                            patient. Return to normal activities  tomorrow.                            Written discharge instructions were provided to the                            patient.                           - Resume previous diet.                           - Continue present medications.                           - Await pathology results.                           - Repeat colonoscopy in 5 years for surveillance                            purposes and family history of CRC. Ludia Gartland L. Loletha Carrow, MD 05/21/2021 2:49:14 PM  This report has been signed electronically.

## 2021-05-21 NOTE — Progress Notes (Signed)
History and Physical:  This patient presents for endoscopic testing for: Encounter Diagnosis  Name Primary?   Family history of malignant neoplasm of gastrointestinal tract Yes    Patient denies chronic abdominal pain, rectal bleeding, constipation or diarrhea.   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 30 tablet 5   Melatonin 10 MG CAPS Take by mouth.     Multiple Vitamin (MULTI-DAY VITAMINS PO) Take by mouth as needed.     tadalafil (CIALIS) 20 MG tablet Take 1 tablet (20 mg total) by mouth daily as needed. 90 tablet 3   zolpidem (AMBIEN CR) 6.25 MG CR tablet TAKE 1 TABLET BY MOUTH EVERY DAY AT BEDTIME AS NEEDED FOR SLEEP 30 tablet 5   omeprazole (PRILOSEC) 20 MG capsule TAKE 1 CAPSULE BY MOUTH EVERY DAY 90 capsule 3   Current Facility-Administered Medications  Medication Dose Route Frequency Provider Last Rate Last Admin   0.9 %  sodium chloride infusion  500 mL Intravenous Once Nelida Meuse III, MD          ___________________________________________________________________ Objective   Exam:  BP  131/60    Pulse 73    Temp (!) 96.2 F (35.7 C)    Ht 5\' 9"  (1.753 m)    Wt 166 lb (75.3 kg)    SpO2 99%    BMI 24.51 kg/m   CV: RRR without murmur, S1/S2 Resp: clear to auscultation bilaterally, normal RR and effort noted GI: soft, no tenderness, with active bowel sounds.   Assessment: Encounter Diagnosis  Name Primary?   Family history of malignant neoplasm of gastrointestinal tract Yes     Plan: Colonoscopy  The benefits and risks of the planned procedure were described in detail with the patient or (when appropriate) their health care proxy.  Risks were outlined as including, but not limited to, bleeding, infection, perforation, adverse medication reaction leading to cardiac or pulmonary decompensation, pancreatitis (if ERCP).  The limitation of incomplete mucosal visualization was also discussed.  No guarantees or warranties were given.   The patient is appropriate for an endoscopic procedure in the ambulatory setting.   - Wilfrid Lund, MD

## 2021-05-21 NOTE — Patient Instructions (Signed)
YOU HAD AN ENDOSCOPIC PROCEDURE TODAY AT THE [facility]:   Refer to the procedure report that was given to you for any specific questions about what was found during the examination.  If the procedure report does not answer your questions, please call your gastroenterologist to clarify.  If you requested that your care partner not be given the details of your procedure findings, then the procedure report has been included in a sealed envelope for you to review at your convenience later.  YOU SHOULD EXPECT: Some feelings of bloating in the abdomen. Passage of more gas than usual.  Walking can help get rid of the air that was put into your GI tract during the procedure and reduce the bloating. If you had a lower endoscopy (such as a colonoscopy or flexible sigmoidoscopy) you may notice spotting of blood in your stool or on the toilet paper. If you underwent a bowel prep for your procedure, you may not have a normal bowel movement for a few days.  Please Note:  You might notice some irritation and congestion in your nose or some drainage.  This is from the oxygen used during your procedure.  There is no need for concern and it should clear up in a day or so.  SYMPTOMS TO REPORT IMMEDIATELY:   Following lower endoscopy (colonoscopy or flexible sigmoidoscopy):  Excessive amounts of blood in the stool  Significant tenderness or worsening of abdominal pains  Swelling of the abdomen that is new, acute  Fever of 100F or higher    For urgent or emergent issues, a gastroenterologist can be reached at any hour by calling [phone]. Do not use MyChart messaging for urgent concerns.    DIET:  We do recommend a small meal at first, but then you may proceed to your regular diet.  Drink plenty of fluids but you should avoid alcoholic beverages for 24 hours.  ACTIVITY:  You should plan to take it easy for the rest of today and you should NOT DRIVE or use heavy machinery until tomorrow  (because of the sedation medicines used during the test).    FOLLOW UP: Our staff will call the number listed on your records 48-72 hours following your procedure to check on you and address any questions or concerns that you may have regarding the information given to you following your procedure. If we do not reach you, we will leave a message.  We will attempt to reach you two times.  During this call, we will ask if you have developed any symptoms of COVID 19. If you develop any symptoms (ie: fever, flu-like symptoms, shortness of breath, cough etc.) before then, please call [phone].  If you test positive for Covid 19 in the 2 weeks post procedure, please call and report this information to us.    If any biopsies were taken you will be contacted by phone or by letter within the next 1-3 weeks.  Please call us at [phone] if you have not heard about the biopsies in 3 weeks.    SIGNATURES/CONFIDENTIALITY: You and/or your care partner have signed paperwork which will be entered into your electronic medical record.  These signatures attest to the fact that that the information above on your After Visit Summary has been reviewed and is understood.  Full responsibility of the confidentiality of this discharge information lies with you and/or your care-partner.   Resume medications. Information given on polyps and diverticulosis. 

## 2021-05-23 ENCOUNTER — Telehealth: Payer: Self-pay

## 2021-05-23 NOTE — Telephone Encounter (Signed)
°  Follow up Call-  Call back number 05/21/2021  Post procedure Call Back phone  # [phone]  Permission to leave phone message Yes  Some recent data might be hidden     Patient questions:  Do you have a fever, pain , or abdominal swelling? No. Pain Score  0 *  Have you tolerated food without any problems? Yes.    Have you been able to return to your normal activities? Yes.    Do you have any questions about your discharge instructions: Diet   No. Medications  No. Follow up visit  No.  Do you have questions or concerns about your Care? No.  Actions: * If pain score is 4 or above: No action needed, pain <4.  Have you developed a fever since your procedure? no  2.   Have you had an respiratory symptoms (SOB or cough) since your procedure? no  3.   Have you tested positive for COVID 19 since your procedure no  4.   Have you had any family members/close contacts diagnosed with the COVID 19 since your procedure?  no   If yes to any of these questions please route to Joylene John, RN and Joella Prince, RN

## 2021-06-04 ENCOUNTER — Encounter: Payer: Self-pay | Admitting: Gastroenterology

## 2021-06-06 ENCOUNTER — Other Ambulatory Visit: Payer: Self-pay | Admitting: Family Medicine

## 2021-06-06 DIAGNOSIS — G4700 Insomnia, unspecified: Secondary | ICD-10-CM

## 2021-06-10 NOTE — Telephone Encounter (Signed)
Last OV- 03/27/21 Last refill: 12/12/20 Disp: 30 Refills: 5

## 2021-09-22 ENCOUNTER — Encounter: Payer: Self-pay | Admitting: Family Medicine

## 2021-09-24 DIAGNOSIS — L821 Other seborrheic keratosis: Secondary | ICD-10-CM | POA: Diagnosis not present

## 2021-09-24 DIAGNOSIS — L812 Freckles: Secondary | ICD-10-CM | POA: Diagnosis not present

## 2021-09-24 DIAGNOSIS — D1801 Hemangioma of skin and subcutaneous tissue: Secondary | ICD-10-CM | POA: Diagnosis not present

## 2021-09-24 DIAGNOSIS — H61001 Unspecified perichondritis of right external ear: Secondary | ICD-10-CM | POA: Diagnosis not present

## 2021-09-24 DIAGNOSIS — L570 Actinic keratosis: Secondary | ICD-10-CM | POA: Diagnosis not present

## 2021-09-25 ENCOUNTER — Ambulatory Visit: Payer: Medicare PPO | Admitting: Family Medicine

## 2021-10-04 ENCOUNTER — Other Ambulatory Visit: Payer: Self-pay | Admitting: Family Medicine

## 2021-10-09 ENCOUNTER — Other Ambulatory Visit: Payer: Self-pay | Admitting: Family Medicine

## 2021-10-10 NOTE — Telephone Encounter (Signed)
Last visit: 03/27/21 ? ?Next visit: 12/22/21 ? ?Last filled: 04/07/21 ? ?Quantity: 30 w/ 5 refills  ?

## 2021-11-03 ENCOUNTER — Other Ambulatory Visit: Payer: Self-pay | Admitting: Family Medicine

## 2021-12-07 ENCOUNTER — Other Ambulatory Visit: Payer: Self-pay | Admitting: Family Medicine

## 2021-12-07 DIAGNOSIS — G4700 Insomnia, unspecified: Secondary | ICD-10-CM

## 2021-12-22 ENCOUNTER — Ambulatory Visit: Payer: Medicare PPO | Admitting: Family Medicine

## 2021-12-22 ENCOUNTER — Encounter: Payer: Self-pay | Admitting: Family Medicine

## 2021-12-22 VITALS — BP 124/64 | HR 70 | Temp 98.0°F | Ht 69.0 in | Wt 165.8 lb

## 2021-12-22 DIAGNOSIS — R739 Hyperglycemia, unspecified: Secondary | ICD-10-CM

## 2021-12-22 DIAGNOSIS — N529 Male erectile dysfunction, unspecified: Secondary | ICD-10-CM

## 2021-12-22 DIAGNOSIS — G4700 Insomnia, unspecified: Secondary | ICD-10-CM | POA: Diagnosis not present

## 2021-12-22 DIAGNOSIS — E785 Hyperlipidemia, unspecified: Secondary | ICD-10-CM | POA: Diagnosis not present

## 2021-12-22 LAB — POCT GLYCOSYLATED HEMOGLOBIN (HGB A1C): Hemoglobin A1C: 5.4 % (ref 4.0–5.6)

## 2021-12-22 MED ORDER — TADALAFIL 20 MG PO TABS: 20.0000 mg | ORAL_TABLET | Freq: Every day | ORAL | 3 refills | Status: DC | PRN

## 2021-12-22 NOTE — Assessment & Plan Note (Signed)
Stable on Ambien 6.25 mg nightly as needed.

## 2021-12-22 NOTE — Assessment & Plan Note (Signed)
A1c stable at 5.4.

## 2021-12-22 NOTE — Progress Notes (Signed)
   Robert Cowan is a 71 y.o. male who presents today for an office visit.  Assessment/Plan:  Chronic Problems Addressed Today: Dyslipidemia He will come back soon for CPE and we can check labs at that point.  We will check cardiac CT scan per patient request.  Hyperglycemia A1c stable at 5.4.  ED (erectile dysfunction) Doing well on Cialis.  Will refill today.  Insomnia Stable on Ambien 6.25 mg nightly as needed.     Subjective:  HPI:  See A/P for status of chronic conditions.       Objective:  Physical Exam: BP 124/64   Pulse 70   Temp 98 F (36.7 C) (Temporal)   Ht '5\' 9"'$  (1.753 m)   Wt 165 lb 12.8 oz (75.2 kg)   SpO2 98%   BMI 24.48 kg/m   Gen: No acute distress, resting comfortably CV: Regular rate and rhythm with no murmurs appreciated Pulm: Normal work of breathing, clear to auscultation bilaterally with no crackles, wheezes, or rhonchi Neuro: Grossly normal, moves all extremities Psych: Normal affect and thought content      Robert Cowan M. [name], MD 12/22/2021 11:16 AM

## 2021-12-22 NOTE — Assessment & Plan Note (Signed)
Doing well on Cialis.  Will refill today.

## 2021-12-22 NOTE — Patient Instructions (Signed)
It was very nice to see you today!  No glad that you are doing well.  I will refill your Cialis.  We will order a cardiac CT scan.  We will see back in a few months for your annual physical.  Please come back to see [place] sooner if needed.  Take care, Dr [name]  PLEASE NOTE:  If you had any lab tests please let us know if you have not heard back within a few days. You may see your results on mychart before we have a chance to review them but we will give you a call once they are reviewed by [place]. If we ordered any referrals today, please let us know if you have not heard from their office within the next week.   Please try these tips to maintain a healthy lifestyle:  Eat at least 3 REAL meals and 1-2 snacks per day.  Aim for no more than 5 hours between eating.  If you eat breakfast, please do so within one hour of getting up.   Each meal should contain half fruits/vegetables, one quarter protein, and one quarter carbs (no bigger than a computer mouse)  Cut down on sweet beverages. This includes juice, soda, and sweet tea.   Drink at least 1 glass of water with each meal and aim for at least 8 glasses per day  Exercise at least 150 minutes every week.

## 2021-12-22 NOTE — Assessment & Plan Note (Signed)
He will come back soon for CPE and we can check labs at that point.  We will check cardiac CT scan per patient request.

## 2021-12-25 ENCOUNTER — Ambulatory Visit (INDEPENDENT_AMBULATORY_CARE_PROVIDER_SITE_OTHER): Payer: Medicare PPO

## 2021-12-25 DIAGNOSIS — Z0000 Encounter for general adult medical examination without abnormal findings: Secondary | ICD-10-CM

## 2021-12-25 NOTE — Progress Notes (Addendum)
Virtual Visit via Telephone Note  I connected with  Robert Cowan on 12/25/21 at  9:45 AM EDT by telephone and verified that I am speaking with the correct person using two identifiers.  Medicare Annual Wellness visit completed telephonically due to Covid-19 pandemic.   Persons participating in this call: This Health Coach and this patient.   Location: Patient: Home Provider: office   I discussed the limitations, risks, security and privacy concerns of performing an evaluation and management service by telephone and the availability of in person appointments. The patient expressed understanding and agreed to proceed.  Unable to perform video visit due to video visit attempted and failed and/or patient does not have video capability.   Some vital signs may be absent or patient reported.   Willette Brace, LPN   Subjective:   Robert Cowan is a 71 y.o. male who presents for an Initial Medicare Annual Wellness Visit.  Review of Systems     Cardiac Risk Factors include: advanced age (>24mn, >>29women);dyslipidemia;male gender     Objective:    There were no vitals filed for this visit. There is no height or weight on file to calculate BMI.     12/25/2021    9:48 AM 12/12/2020    9:37 AM 12/07/2019   11:26 AM 10/30/2015    3:30 PM  Advanced Directives  Does Patient Have a Medical Advance Directive? Yes No No No  Does patient want to make changes to medical advance directive? Yes (MAU/Ambulatory/Procedural Areas - Information given)     Would patient like information on creating a medical advance directive?  Yes (MAU/Ambulatory/Procedural Areas - Information given) Yes (MAU/Ambulatory/Procedural Areas - Information given) No - patient declined information    Current Medications (verified) Outpatient Encounter Medications as of 12/25/2021  Medication Sig   acyclovir (ZOVIRAX) 200 MG capsule TAKE 1 CAPSULE BY MOUTH 5 TIMES A DAY AS SOON AS SYPTOMS START AND CONTINUE FOR 5 DAYS    LORazepam (ATIVAN) 0.5 MG tablet TAKE 1 TABLET BY MOUTH EVERYDAY AT BEDTIME   Melatonin 10 MG CAPS Take by mouth.   mometasone (ELOCON) 0.1 % ointment SMARTSIG:1 Application In Ear(s) Twice Daily   Multiple Vitamin (MULTI-DAY VITAMINS PO) Take by mouth as needed.   omeprazole (PRILOSEC) 20 MG capsule TAKE 1 CAPSULE BY MOUTH EVERY DAY   tadalafil (CIALIS) 20 MG tablet Take 1 tablet (20 mg total) by mouth daily as needed.   zolpidem (AMBIEN CR) 6.25 MG CR tablet TAKE 1 TABLET BY MOUTH AT BEDTIME AS NEEDED FOR SLEEP   No facility-administered encounter medications on file as of 12/25/2021.    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   Past Surgical History:  Procedure Laterality Date   COLONOSCOPY     MELANOMA EXCISION     left cheek/face   NOSE SURGERY     Deviated Septum   Family History  Problem Relation Age of Onset   Colon cancer Mother    Esophageal cancer Neg Hx    Rectal cancer Neg Hx    Stomach cancer Neg Hx    Social History   Socioeconomic History   Marital status: Married    Spouse name: Not on file  Number of children: Not on file   Years of education: Not on file   Highest education level: Not on file  Occupational History   Occupation: Retired  Tobacco Use   Smoking status: Never   Smokeless tobacco: Never  Vaping Use   Vaping Use: Never used  Substance and Sexual Activity   Alcohol use: Yes    Alcohol/week: 3.0 standard drinks of alcohol    Types: 3 Cans of beer per week    Comment: mixture of beer, wine, liquor   Drug use: No   Sexual activity: Not on file  Other Topics Concern   Not on file  Social History Narrative   Single   Gets regular exercise   Social  Determinants of Health   Financial Resource Strain: Low Risk  (12/25/2021)   Overall Financial Resource Strain (CARDIA)    Difficulty of Paying Living Expenses: Not hard at all  Food Insecurity: No Food Insecurity (12/25/2021)   Hunger Vital Sign    Worried About Running Out of Food in the Last Year: Never true    Ran Out of Food in the Last Year: Never true  Transportation Needs: No Transportation Needs (12/25/2021)   PRAPARE - Hydrologist (Medical): No    Lack of Transportation (Non-Medical): No  Physical Activity: Sufficiently Active (12/25/2021)   Exercise Vital Sign    Days of Exercise per Week: 2 days    Minutes of Exercise per Session: 120 min  Stress: No Stress Concern Present (12/25/2021)   [place]    Feeling of Stress : Not at all  Social Connections: Moderately Integrated (12/25/2021)   Social Connection and Isolation Panel [NHANES]    Frequency of Communication with Friends and Family: More than three times a week    Frequency of Social Gatherings with Friends and Family: More than three times a week    Attends Religious Services: More than 4 times per year    Active Member of [employer] or Organizations: No    Attends Music therapist: Never    Marital Status: Married    Tobacco Counseling Counseling given: Not Answered   Clinical Intake:  Pre-visit preparation completed: Yes  Pain : No/denies pain     BMI - recorded: 24.48 Nutritional Status: BMI of 19-24  Normal Nutritional Risks: None Diabetes: No  How often do you need to have someone help you when you read instructions, pamphlets, or other written materials from your doctor or pharmacy?: 1 - Never  Diabetic?no  Interpreter Needed?: No  Information entered by :: Charlott Rakes, LPN   Activities of Daily Living    12/25/2021    9:49 AM  In your present state of health, do you have any  difficulty performing the following activities:  Hearing? 1  Comment tinnitus at times  Vision? 0  Difficulty concentrating or making decisions? 0  Walking or climbing stairs? 0  Dressing or bathing? 0  Doing errands, shopping? 0  Preparing Food and eating ? N  Using the Toilet? N  In the past six months, have you accidently leaked urine? N  Do you have problems with loss of bowel control? N  Managing your Medications? N  Managing your Finances? N  Housekeeping or managing your Housekeeping? N    Patient Care Team: Vivi Barrack, MD as PCP - General (Family Medicine)  Indicate any recent Medical Services you may have received from other than  [facility] providers in the past year (date may be approximate).     Assessment:   This is a routine wellness examination for [place].  Hearing/Vision screen Hearing Screening - Comments:: Pt still has episodes of tinnitus  Vision Screening - Comments:: Pt follows [facility] for annual eye exams   Dietary issues and exercise activities discussed: Current Exercise Habits: Home exercise routine, Type of exercise: Other - see comments (pickle ball), Time (Minutes): > 60, Frequency (Times/Week): 2, Weekly Exercise (Minutes/Week): 0   Goals Addressed             This Visit's Progress    Patient Stated       Stay healthy        Depression Screen    12/25/2021    9:47 AM 12/22/2021   10:42 AM 03/27/2021   10:36 AM 12/12/2020    9:36 AM 12/07/2019   11:24 AM 08/25/2019    9:52 AM 03/24/2019    8:26 AM  PHQ 2/9 Scores  PHQ - 2 Score 0 0 0 0 0 0 0  PHQ- 9 Score       0    Fall Risk    12/25/2021    9:48 AM 12/22/2021   10:42 AM 03/27/2021   10:36 AM 12/12/2020    9:40 AM 12/07/2019   11:30 AM  Fall Risk   Falls in the past year? 0 0 0 0 0  Number falls in past yr: 0 0 0 0 0  Injury with Fall? 0 0 0 0 0  Risk for fall due to :  No Fall Risks No Fall Risks  No Fall Risks  Follow up Falls prevention discussed   Falls prevention  discussed Falls prevention discussed    FALL RISK PREVENTION PERTAINING TO THE HOME:  Any stairs in or around the home? Yes  If so, are there any without handrails? No  Home free of loose throw rugs in walkways, pet beds, electrical cords, etc? Yes  Adequate lighting in your home to reduce risk of falls? Yes   ASSISTIVE DEVICES UTILIZED TO PREVENT FALLS:  Life alert? No  Use of a cane, walker or w/c? No  Grab bars in the bathroom? No  Shower chair or bench in shower? Yes  Elevated toilet seat or a handicapped toilet? No   TIMED UP AND GO:  Was the test performed? No .    Cognitive Function:        12/25/2021    9:50 AM 12/12/2020    9:42 AM 12/07/2019   11:33 AM  6CIT Screen  What Year? 0 points 0 points   What month? 0 points 0 points   What time? 0 points 0 points   Count back from 20 0 points 0 points 0 points  Months in reverse 0 points 0 points 0 points  Repeat phrase 0 points 0 points 0 points  Total Score 0 points 0 points     Immunizations Immunization History  Administered Date(s) Administered   Influenza Split 03/08/2012   Influenza, High Dose Seasonal PF 04/21/2017, 04/25/2018, 02/29/2020   Influenza,inj,Quad PF,6+ Mos 02/16/2019   Influenza-Unspecified 02/25/2021   PFIZER Comirnaty(Gray Top)Covid-19 Tri-Sucrose Vaccine 09/16/2020   PFIZER(Purple Top)SARS-COV-2 Vaccination 06/28/2019, 07/17/2019   Pfizer Covid-19 Vaccine Bivalent Booster 64yr & up 04/01/2021   Pneumococcal Conjugate-13 11/10/2017   Pneumococcal Polysaccharide-23 03/24/2019   Td 02/06/1998, 12/28/2008, 12/06/2020   Zoster, Live 10/05/2015    TDAP status: Up to date  Flu Vaccine  status: Up to date  Pneumococcal vaccine status: Up to date  Covid-19 vaccine status: Completed vaccines  Qualifies for Shingles Vaccine? Yes   Zostavax completed No   Shingrix Completed?: No.    Education has been provided regarding the importance of this vaccine. Patient has been advised to call  insurance company to determine out of pocket expense if they have not yet received this vaccine. Advised may also receive vaccine at local pharmacy or Health Dept. Verbalized acceptance and understanding.  Screening Tests Health Maintenance  Topic Date Due   Zoster Vaccines- Shingrix (1 of 2) Never done   COVID-19 Vaccine (5 - Pfizer series) 01/07/2022 (Originally 08/02/2021)   INFLUENZA VACCINE  01/06/2022   COLONOSCOPY (Pts 45-35yr Insurance coverage will need to be confirmed)  05/21/2026   TETANUS/TDAP  12/07/2030   Pneumonia Vaccine 70 Years old  Completed   Hepatitis C Screening  Completed   HPV VACCINES  Aged Out    Health Maintenance  Health Maintenance Due  Topic Date Due   Zoster Vaccines- Shingrix (1 of 2) Never done    Colorectal cancer screening: Type of screening: Colonoscopy. Completed 05/21/21. Repeat every 5 years   Additional Screening:  Hepatitis C Screening:  Completed 11/10/17  Vision Screening: Recommended annual ophthalmology exams for early detection of glaucoma and other disorders of the eye. Is the patient up to date with their annual eye exam?  Yes  Who is the provider or what is the name of the office in which the patient attends annual eye exams? [facility]  If pt is not established with a provider, would they like to be referred to a provider to establish care? No .   Dental Screening: Recommended annual dental exams for proper oral hygiene  Community Resource Referral / Chronic Care Management: CRR required this visit?  No   CCM required this visit?  No      Plan:     I have personally reviewed and noted the following in the patient's chart:   Medical and social history Use of alcohol, tobacco or illicit drugs  Current medications and supplements including opioid prescriptions. Patient is not currently taking opioid prescriptions. Functional ability and status Nutritional status Physical activity Advanced directives List of other  physicians Hospitalizations, surgeries, and ER visits in previous 12 months Vitals Screenings to include cognitive, depression, and falls Referrals and appointments  In addition, I have reviewed and discussed with patient certain preventive protocols, quality metrics, and best practice recommendations. A written personalized care plan for preventive services as well as general preventive health recommendations were provided to patient.     TWillette Brace LPN   [id number]  Nurse Notes: pt stated he received shingrix, after calling RX,  pt only received zoster. Pt made aware unable to locate vaccine.

## 2021-12-25 NOTE — Patient Instructions (Signed)
[name] , Thank you for taking time to come for your Medicare Wellness Visit. I appreciate your ongoing commitment to your health goals. Please review the following plan we discussed and let me know if I can assist you in the future.   Screening recommendations/referrals: Colonoscopy: Done 05/21/21 repeat every 5 years  Recommended yearly ophthalmology/optometry visit for glaucoma screening and checkup Recommended yearly dental visit for hygiene and checkup  Vaccinations: Influenza vaccine: Done 02/25/21 repeat every year  Pneumococcal vaccine: Up to date Tdap vaccine: done 12/06/20 repeat every 10 years  Shingles vaccine: Shingrix discussed. Please contact your pharmacy for coverage information.    Covid-19: completed 1/20, 07/17/19 & 4/11, 04/01/21  Advanced directives:    Conditions/risks identified: stay healthy   Next appointment: Follow up in one year for your annual wellness visit.   Preventive Care 70 Years and Older, Male Preventive care refers to lifestyle choices and visits with your health care provider that can promote health and wellness. What does preventive care include? A yearly physical exam. This is also called an annual well check. Dental exams once or twice a year. Routine eye exams. Ask your health care provider how often you should have your eyes checked. Personal lifestyle choices, including: Daily care of your teeth and gums. Regular physical activity. Eating a healthy diet. Avoiding tobacco and drug use. Limiting alcohol use. Practicing safe sex. Taking low doses of aspirin every day. Taking vitamin and mineral supplements as recommended by your health care provider. What happens during an annual well check? The services and screenings done by your health care provider during your annual well check will depend on your age, overall health, lifestyle risk factors, and family history of disease. Counseling  Your health care provider may ask you questions  about your: Alcohol use. Tobacco use. Drug use. Emotional well-being. Home and relationship well-being. Sexual activity. Eating habits. History of falls. Memory and ability to understand (cognition). Work and work Statistician. Screening  You may have the following tests or measurements: Height, weight, and BMI. Blood pressure. Lipid and cholesterol levels. These may be checked every 5 years, or more frequently if you are over 70 years old. Skin check. Lung cancer screening. You may have this screening every year starting at age 70 if you have a 30-pack-year history of smoking and currently smoke or have quit within the past 15 years. Fecal occult blood test (FOBT) of the stool. You may have this test every year starting at age 70. Flexible sigmoidoscopy or colonoscopy. You may have a sigmoidoscopy every 5 years or a colonoscopy every 10 years starting at age 70. Prostate cancer screening. Recommendations will vary depending on your family history and other risks. Hepatitis C blood test. Hepatitis B blood test. Sexually transmitted disease (STD) testing. Diabetes screening. This is done by checking your blood sugar (glucose) after you have not eaten for a while (fasting). You may have this done every 1-3 years. Abdominal aortic aneurysm (AAA) screening. You may need this if you are a current or former smoker. Osteoporosis. You may be screened starting at age 70 if you are at high risk. Talk with your health care provider about your test results, treatment options, and if necessary, the need for more tests. Vaccines  Your health care provider may recommend certain vaccines, such as: Influenza vaccine. This is recommended every year. Tetanus, diphtheria, and acellular pertussis (Tdap, Td) vaccine. You may need a Td booster every 10 years. Zoster vaccine. You may need this after age  70. Pneumococcal 13-valent conjugate (PCV13) vaccine. One dose is recommended after age 70. Pneumococcal  polysaccharide (PPSV23) vaccine. One dose is recommended after age 70. Talk to your health care provider about which screenings and vaccines you need and how often you need them. This information is not intended to replace advice given to you by your health care provider. Make sure you discuss any questions you have with your health care provider. Document Released: 06/21/2015 Document Revised: 02/12/2016 Document Reviewed: 03/26/2015 Elsevier Interactive Patient Education  2017 [employer] Prevention in the Home Falls can cause injuries. They can happen to people of all ages. There are many things you can do to make your home safe and to help prevent falls. What can I do on the outside of my home? Regularly fix the edges of walkways and driveways and fix any cracks. Remove anything that might make you trip as you walk through a door, such as a raised step or threshold. Trim any bushes or trees on the path to your home. Use bright outdoor lighting. Clear any walking paths of anything that might make someone trip, such as rocks or tools. Regularly check to see if handrails are loose or broken. Make sure that both sides of any steps have handrails. Any raised decks and porches should have guardrails on the edges. Have any leaves, snow, or ice cleared regularly. Use sand or salt on walking paths during winter. Clean up any spills in your garage right away. This includes oil or grease spills. What can I do in the bathroom? Use night lights. Install grab bars by the toilet and in the tub and shower. Do not use towel bars as grab bars. Use non-skid mats or decals in the tub or shower. If you need to sit down in the shower, use a plastic, non-slip stool. Keep the floor dry. Clean up any water that spills on the floor as soon as it happens. Remove soap buildup in the tub or shower regularly. Attach bath mats securely with double-sided non-slip rug tape. Do not have throw rugs and other  things on the floor that can make you trip. What can I do in the bedroom? Use night lights. Make sure that you have a light by your bed that is easy to reach. Do not use any sheets or blankets that are too big for your bed. They should not hang down onto the floor. Have a firm chair that has side arms. You can use this for support while you get dressed. Do not have throw rugs and other things on the floor that can make you trip. What can I do in the kitchen? Clean up any spills right away. Avoid walking on wet floors. Keep items that you use a lot in easy-to-reach places. If you need to reach something above you, use a strong step stool that has a grab bar. Keep electrical cords out of the way. Do not use floor polish or wax that makes floors slippery. If you must use wax, use non-skid floor wax. Do not have throw rugs and other things on the floor that can make you trip. What can I do with my stairs? Do not leave any items on the stairs. Make sure that there are handrails on both sides of the stairs and use them. Fix handrails that are broken or loose. Make sure that handrails are as long as the stairways. Check any carpeting to make sure that it is firmly attached to the stairs. Fix  any carpet that is loose or worn. Avoid having throw rugs at the top or bottom of the stairs. If you do have throw rugs, attach them to the floor with carpet tape. Make sure that you have a light switch at the top of the stairs and the bottom of the stairs. If you do not have them, ask someone to add them for you. What else can I do to help prevent falls? Wear shoes that: Do not have high heels. Have rubber bottoms. Are comfortable and fit you well. Are closed at the toe. Do not wear sandals. If you use a stepladder: Make sure that it is fully opened. Do not climb a closed stepladder. Make sure that both sides of the stepladder are locked into place. Ask someone to hold it for you, if possible. Clearly  mark and make sure that you can see: Any grab bars or handrails. First and last steps. Where the edge of each step is. Use tools that help you move around (mobility aids) if they are needed. These include: Canes. Walkers. Scooters. Crutches. Turn on the lights when you go into a dark area. Replace any light bulbs as soon as they burn out. Set up your furniture so you have a clear path. Avoid moving your furniture around. If any of your floors are uneven, fix them. If there are any pets around you, be aware of where they are. Review your medicines with your doctor. Some medicines can make you feel dizzy. This can increase your chance of falling. Ask your doctor what other things that you can do to help prevent falls. This information is not intended to replace advice given to you by your health care provider. Make sure you discuss any questions you have with your health care provider. Document Released: 03/21/2009 Document Revised: 10/31/2015 Document Reviewed: 06/29/2014 Elsevier Interactive Patient Education  2017 [employer].

## 2021-12-25 NOTE — Addendum Note (Signed)
Addended by: Willette Brace on: 12/25/2021 10:11 AM   Modules accepted: Level of Service

## 2021-12-25 NOTE — Progress Notes (Signed)
I have personally reviewed the Medicare Annual Wellness Visit and agree with the documentation.  [name]. [name], MD 12/25/2021 10:11 AM

## 2022-01-07 ENCOUNTER — Other Ambulatory Visit: Payer: Self-pay | Admitting: Family Medicine

## 2022-01-13 ENCOUNTER — Telehealth: Payer: Self-pay | Admitting: Family Medicine

## 2022-01-13 NOTE — Telephone Encounter (Signed)
Patient states all [facility] are out of lorazepam.  Patient would like to know if there is a different medication Dr. [name] would be able to send to [facility] place of this [facility]orazepam.

## 2022-01-14 ENCOUNTER — Encounter: Payer: Self-pay | Admitting: Family Medicine

## 2022-01-14 DIAGNOSIS — G4700 Insomnia, unspecified: Secondary | ICD-10-CM

## 2022-01-14 NOTE — Telephone Encounter (Signed)
Patient advise to call other pharmacy of choice for transfer Rx  Stated will call back with information

## 2022-01-14 NOTE — Telephone Encounter (Signed)
Spoke with patient, patient will check on other pharmacy

## 2022-01-15 NOTE — Telephone Encounter (Signed)
Patient was able to find medication at [facility] at [address] [place], [place] [address]. He is currently out of his medication. n

## 2022-01-16 NOTE — Telephone Encounter (Signed)
Send information to PCP see previews note

## 2022-01-18 ENCOUNTER — Encounter: Payer: Self-pay | Admitting: Family Medicine

## 2022-01-18 DIAGNOSIS — G4700 Insomnia, unspecified: Secondary | ICD-10-CM

## 2022-01-19 MED ORDER — LORAZEPAM 0.5 MG PO TABS: ORAL_TABLET | ORAL | 5 refills | Status: DC

## 2022-01-20 MED ORDER — LORAZEPAM 1 MG PO TABS: ORAL_TABLET | ORAL | 5 refills | Status: DC

## 2022-02-05 ENCOUNTER — Encounter: Payer: Self-pay | Admitting: Family Medicine

## 2022-02-05 NOTE — Telephone Encounter (Signed)
Please see pt msg and advise 

## 2022-02-05 NOTE — Telephone Encounter (Signed)
Can we have him schedule an appointment to discuss?  Robert Cowan. [name], MD 02/05/2022 12:13 PM

## 2022-02-12 ENCOUNTER — Encounter: Payer: Self-pay | Admitting: Family Medicine

## 2022-02-12 ENCOUNTER — Ambulatory Visit: Payer: Medicare PPO | Admitting: Family Medicine

## 2022-02-12 VITALS — BP 135/85 | HR 64 | Temp 98.1°F | Ht 69.0 in | Wt 164.2 lb

## 2022-02-12 DIAGNOSIS — I259 Chronic ischemic heart disease, unspecified: Secondary | ICD-10-CM | POA: Diagnosis not present

## 2022-02-12 DIAGNOSIS — F439 Reaction to severe stress, unspecified: Secondary | ICD-10-CM | POA: Diagnosis not present

## 2022-02-12 DIAGNOSIS — K219 Gastro-esophageal reflux disease without esophagitis: Secondary | ICD-10-CM | POA: Diagnosis not present

## 2022-02-12 DIAGNOSIS — R109 Unspecified abdominal pain: Secondary | ICD-10-CM

## 2022-02-12 DIAGNOSIS — G4700 Insomnia, unspecified: Secondary | ICD-10-CM | POA: Diagnosis not present

## 2022-02-12 MED ORDER — ZOLPIDEM TARTRATE ER 6.25 MG PO TBCR: 6.2500 mg | EXTENDED_RELEASE_TABLET | Freq: Every evening | ORAL | 5 refills | Status: DC | PRN

## 2022-02-12 MED ORDER — LORAZEPAM 0.5 MG PO TABS: 0.5000 mg | ORAL_TABLET | Freq: Two times a day (BID) | ORAL | 5 refills | Status: DC | PRN

## 2022-02-12 NOTE — Assessment & Plan Note (Signed)
Had discussion with patient and his wife today regarding long-term PPI use.  We did discuss potential side effects of long-term use however also discussed that potential benefit in terms of prevention of Barrett's esophagus and esophageal cancer likely outweighs these risks.  He would like to try an alternative.  He can try taking over-the-counter Pepcid to see if this controls his symptoms.  If not it is okay for him to go back to daily PPI.

## 2022-02-12 NOTE — Progress Notes (Signed)
   Robert Cowan is a 71 y.o. male who presents today for an office visit.  Assessment/Plan:  New/Acute Problems: Chest Pain Has several comorbidities.  EKG  today shows normal sinus rhythm with no acute ischemic changes and similar in appearance to previous EKG.  Past cardiac CT scan pending.  Depending on results may need referral to cardiology.  We discussed reasons to return to care  Abdominal pain No red flags.  Possibly related to stopping his PPI.  Will be restarting as below.  We will be getting CT scan as well per patient request.  Chronic Problems Addressed Today: GERD Had discussion with patient and his wife today regarding long-term PPI use.  We did discuss potential side effects of long-term use however also discussed that potential benefit in terms of prevention of Barrett's esophagus and esophageal cancer likely outweighs these risks.  He would like to try an alternative.  He can try taking over-the-counter Pepcid to see if this controls his symptoms.  If not it is okay for him to go back to daily PPI.  Stress He has had some amount of stress recently.  He feels like these are manageable and should be only temporary.  We did discuss referral to see a therapist versus starting medication however he deferred both of these for now.  Insomnia Stable on Ambien 6.25 mg nightly.  Will refill today.     Subjective:  HPI:  See A/p for status of chronic conditions.    Primary concern today is intermittent chest tightness.  The symptom going on for the last several weeks.  He has been under a lot of stress recently due to difficulty with getting his Ativan at the pharmacy due to it being out of stock, declining health of family members, and social situation of family numbers as well.  Feels occasional chest tightness.  Occasionally also gets indigestion.  We had placed a referral for him to get a cardiac CT scan however this was not able to be scheduled until 10-3.  Symptoms are still  occurring.  He notes symptoms get worse when he gets stressed.  He has not noticed any exertional symptoms.  He stopped taking his PPI due to concern for potential side effects.  Continues noticed more belching and bloating as well as some pressure in his lower abdomen.  He would like get a CT scan of his lower abdomen as well       Objective:  Physical Exam: BP 135/85   Pulse 64   Temp 98.1 F (36.7 C) (Temporal)   Ht '5\' 9"'$  (1.753 m)   Wt 164 lb 3.2 oz (74.5 kg)   SpO2 97%   BMI 24.25 kg/m   Gen: No acute distress, resting comfortably CV: Regular rate and rhythm with no murmurs appreciated Pulm: Normal work of breathing, clear to auscultation bilaterally with no crackles, wheezes, or rhonchi Neuro: Grossly normal, moves all extremities Psych: Normal affect and thought content  EKG: NSR.  Similar in appearance to previous EKG from 8 years ago.      [name]. [name], MD 02/12/2022 12:17 PM

## 2022-02-12 NOTE — Assessment & Plan Note (Signed)
Stable on Ambien 6.25 mg nightly.  Will refill today.

## 2022-02-12 NOTE — Assessment & Plan Note (Signed)
He has had some amount of stress recently.  He feels like these are manageable and should be only temporary.  We did discuss referral to see a therapist versus starting medication however he deferred both of these for now.

## 2022-02-12 NOTE — Patient Instructions (Signed)
It was very nice to see you today!  You can try taking Pepcid for your reflux though it is okay for you to continue with the omeprazole if Pepcid does not work.  We will see if we can move up the cardiac CT scan.  We will also get a CT scan of your abdomen.  We will try to get these at the same time if allowable by radiology.  Please let me know if he would like to see a therapist.  Please let us know if your symptoms worsen or change.  Take care, Dr [name]  PLEASE NOTE:  If you had any lab tests please let us know if you have not heard back within a few days. You may see your results on mychart before we have a chance to review them but we will give you a call once they are reviewed by [place]. If we ordered any referrals today, please let us know if you have not heard from their office within the next week.   Please try these tips to maintain a healthy lifestyle:  Eat at least 3 REAL meals and 1-2 snacks per day.  Aim for no more than 5 hours between eating.  If you eat breakfast, please do so within one hour of getting up.   Each meal should contain half fruits/vegetables, one quarter protein, and one quarter carbs (no bigger than a computer mouse)  Cut down on sweet beverages. This includes juice, soda, and sweet tea.   Drink at least 1 glass of water with each meal and aim for at least 8 glasses per day  Exercise at least 150 minutes every week.

## 2022-02-16 ENCOUNTER — Other Ambulatory Visit: Payer: Medicare PPO

## 2022-02-18 ENCOUNTER — Encounter: Payer: Self-pay | Admitting: Family Medicine

## 2022-02-20 ENCOUNTER — Ambulatory Visit (HOSPITAL_COMMUNITY)
Admission: RE | Admit: 2022-02-20 | Discharge: 2022-02-20 | Disposition: A | Discharge status: Home / Self Care | Payer: Medicare PPO | Source: Ambulatory Visit | Attending: Family Medicine | Admitting: Family Medicine

## 2022-02-20 DIAGNOSIS — I259 Chronic ischemic heart disease, unspecified: Secondary | ICD-10-CM | POA: Insufficient documentation

## 2022-02-21 ENCOUNTER — Ambulatory Visit (HOSPITAL_BASED_OUTPATIENT_CLINIC_OR_DEPARTMENT_OTHER): Payer: Medicare PPO

## 2022-02-24 NOTE — Progress Notes (Signed)
Please inform patient of the following:  CT scan shows that he has a mild amount of calcium buildup in his arteries.  This is below average for his age, however may be reasonable to be aggressive with his lipid management and try to get LDL less than 100.  Recommend referral to cardiology.  He is also incidentally found to have a very small nodule in his lungs.  This should not cause any issue and we do not need to do any further testing at this time.

## 2022-03-09 ENCOUNTER — Other Ambulatory Visit: Payer: Self-pay | Admitting: *Deleted

## 2022-03-09 MED ORDER — ACYCLOVIR 200 MG PO CAPS: ORAL_CAPSULE | ORAL | 0 refills | Status: DC

## 2022-03-10 ENCOUNTER — Other Ambulatory Visit: Payer: No Typology Code available for payment source

## 2022-03-10 ENCOUNTER — Telehealth: Payer: Self-pay | Admitting: Family Medicine

## 2022-03-10 NOTE — Telephone Encounter (Signed)
Caller States: -pt scheduled for Cardiac Scoring 10/03. -pt had cardiac scoring on 02/20/22 -appt for patient cancelled with [facility] because imaging is available in Epic. -She will be available for follow up questions

## 2022-03-10 NOTE — Telephone Encounter (Signed)
See note

## 2022-03-12 NOTE — Telephone Encounter (Signed)
CAn we please clarify? He has a CT abdomen and pelvis that was ordered last month. This needs to be scheduled ASAP.  [name]. [name], MD 03/12/2022 11:05 AM

## 2022-03-13 ENCOUNTER — Encounter: Payer: Self-pay | Admitting: Family Medicine

## 2022-03-16 NOTE — Telephone Encounter (Signed)
See note

## 2022-03-23 ENCOUNTER — Ambulatory Visit
Admission: RE | Admit: 2022-03-23 | Discharge: 2022-03-23 | Disposition: A | Discharge status: Home / Self Care | Payer: Medicare PPO | Source: Ambulatory Visit | Attending: Family Medicine | Admitting: Family Medicine

## 2022-03-23 DIAGNOSIS — R109 Unspecified abdominal pain: Secondary | ICD-10-CM

## 2022-03-23 DIAGNOSIS — K5730 Diverticulosis of large intestine without perforation or abscess without bleeding: Secondary | ICD-10-CM | POA: Diagnosis not present

## 2022-03-23 MED ORDER — IOPAMIDOL (ISOVUE-300) INJECTION 61%
100.0000 mL | Freq: Once | INTRAVENOUS | Status: AC | PRN
  Administered 2022-03-23: 100 mL via INTRAVENOUS

## 2022-03-26 DIAGNOSIS — L812 Freckles: Secondary | ICD-10-CM | POA: Diagnosis not present

## 2022-03-26 DIAGNOSIS — L820 Inflamed seborrheic keratosis: Secondary | ICD-10-CM | POA: Diagnosis not present

## 2022-03-26 DIAGNOSIS — D1801 Hemangioma of skin and subcutaneous tissue: Secondary | ICD-10-CM | POA: Diagnosis not present

## 2022-03-26 DIAGNOSIS — H61001 Unspecified perichondritis of right external ear: Secondary | ICD-10-CM | POA: Diagnosis not present

## 2022-03-26 DIAGNOSIS — D485 Neoplasm of uncertain behavior of skin: Secondary | ICD-10-CM | POA: Diagnosis not present

## 2022-03-26 DIAGNOSIS — L110 Acquired keratosis follicularis: Secondary | ICD-10-CM | POA: Diagnosis not present

## 2022-03-26 DIAGNOSIS — L821 Other seborrheic keratosis: Secondary | ICD-10-CM | POA: Diagnosis not present

## 2022-03-26 NOTE — Progress Notes (Signed)
Please inform patient of the following:  Ct scan shows diverticulosis which we already know about but no other causes for his abdominal pain.  Would like for him to follow-up with [place] if pain is still persisting.

## 2022-04-07 ENCOUNTER — Ambulatory Visit: Payer: Medicare PPO | Admitting: Family Medicine

## 2022-04-07 ENCOUNTER — Encounter: Payer: Self-pay | Admitting: Family Medicine

## 2022-04-07 VITALS — BP 130/77 | HR 65 | Temp 97.3°F | Ht 69.0 in | Wt 167.0 lb

## 2022-04-07 DIAGNOSIS — K219 Gastro-esophageal reflux disease without esophagitis: Secondary | ICD-10-CM

## 2022-04-07 DIAGNOSIS — R1032 Left lower quadrant pain: Secondary | ICD-10-CM | POA: Diagnosis not present

## 2022-04-07 DIAGNOSIS — R079 Chest pain, unspecified: Secondary | ICD-10-CM | POA: Diagnosis not present

## 2022-04-07 DIAGNOSIS — R911 Solitary pulmonary nodule: Secondary | ICD-10-CM | POA: Diagnosis not present

## 2022-04-07 MED ORDER — OMEPRAZOLE 20 MG PO CPDR
20.0000 mg | DELAYED_RELEASE_CAPSULE | Freq: Every day | ORAL | 3 refills | Status: DC
Start: 2022-04-07 — End: 2023-04-12

## 2022-04-07 NOTE — Assessment & Plan Note (Signed)
Symptoms are stable.  We will refill his omeprazole 20 mg daily.

## 2022-04-07 NOTE — Assessment & Plan Note (Signed)
Incidentally found on cardiac CT scan.  Reassured patient that did not need any further follow-up given he does not have any other risk factors however he would like to have a repeat CT scan done in year to ensure stability.  We will place order today.

## 2022-04-07 NOTE — Progress Notes (Signed)
   Robert Cowan is a 71 y.o. male who presents today for an office visit.  Assessment/Plan:  New/Acute Problems: Chest Pain It is reassuring that symptoms have resolved.  History is atypical for cardiac etiology.  His cardiac CT scan was below average for his age.  We will continue with watch waiting for now.  If symptoms return will need referral to cardiology.  We discussed reasons to return to care and seek emergent care.  LLQ Abdominal Pain Symptoms have resolved.  Likely had a mild diverticulitis flare that had resolved by the time he had his CT scan.  Given that symptoms are resolved do not need to do any further testing at this point.  We will continue with watchful waiting.  We discussed reasons to return to care and seek emergent care.  Chronic Problems Addressed Today: GERD Symptoms are stable.  We will refill his omeprazole 20 mg daily.  Pulmonary nodule Incidentally found on cardiac CT scan.  Reassured patient that did not need any further follow-up given he does not have any other risk factors however he would like to have a repeat CT scan done in year to ensure stability.  We will place order today.     Subjective:  HPI:  See A/p for status of chronic conditions.  We saw him 2 months ago.  At that time he was having issues with intermittent chest tightness and some left lower quadrant abdominal pain.  Thankfully both the symptoms have resolved since our last visit.  He was under a lot of stress at her last visit and I think this was contributing to both.  He would like to restart omeprazole.  He did have CT scans done both were cardiac calcium score and abdominal within the last few weeks.  Cardiac CT scan showed calcification at the 43rd percentile for age and gender.  His CT abdomen was notable for diverticulosis without diverticulitis.  He is here today to follow up on recent CT scans.         Objective:  Physical Exam: BP 130/77   Pulse 65   Temp (!) 97.3 F  (36.3 C) (Temporal)   Ht '5\' 9"'$  (1.753 m)   Wt 167 lb (75.8 kg)   SpO2 97%   BMI 24.66 kg/m   Gen: No acute distress, resting comfortably  Neuro: Grossly normal, moves all extremities Psych: Normal affect and thought content      Wren Gallaga M. [name], MD 04/07/2022 11:44 AM

## 2022-04-07 NOTE — Patient Instructions (Signed)
It was very nice to see you today!  Your CT scans are reassuring.  We can repeat your lung CT scan next year.  I will refill your omeprazole.  We will see you back next year for your annual physical.  Come back sooner if needed.  Take care, Dr [name]  PLEASE NOTE:  If you had any lab tests please let us know if you have not heard back within a few days. You may see your results on mychart before we have a chance to review them but we will give you a call once they are reviewed by [place]. If we ordered any referrals today, please let us know if you have not heard from their office within the next week.   Please try these tips to maintain a healthy lifestyle:  Eat at least 3 REAL meals and 1-2 snacks per day.  Aim for no more than 5 hours between eating.  If you eat breakfast, please do so within one hour of getting up.   Each meal should contain half fruits/vegetables, one quarter protein, and one quarter carbs (no bigger than a computer mouse)  Cut down on sweet beverages. This includes juice, soda, and sweet tea.   Drink at least 1 glass of water with each meal and aim for at least 8 glasses per day  Exercise at least 150 minutes every week.

## 2022-07-28 ENCOUNTER — Telehealth: Payer: Self-pay | Admitting: Family Medicine

## 2022-07-28 NOTE — Telephone Encounter (Signed)
Caller states he was calling to confirm if patient was still taking cialis 20 mg. Requests a call back with confirmation so he knows if he could refill it or not. Can be reached at [phone]

## 2022-07-29 ENCOUNTER — Other Ambulatory Visit: Payer: Self-pay

## 2022-07-29 DIAGNOSIS — N529 Male erectile dysfunction, unspecified: Secondary | ICD-10-CM

## 2022-07-29 NOTE — Telephone Encounter (Signed)
Please advise 

## 2022-07-29 NOTE — Telephone Encounter (Signed)
Please discuss with patient.  Ok to refill if patient needs it.  [name]. [name], MD 07/29/2022 8:20 AM

## 2022-07-29 NOTE — Telephone Encounter (Signed)
Patient is tolerating well and has mailed off new script to pharmacy. LOMVM with pharmacy to call me back

## 2022-07-29 NOTE — Telephone Encounter (Signed)
Gave OK to fill medication for patient

## 2022-08-10 DIAGNOSIS — R059 Cough, unspecified: Secondary | ICD-10-CM | POA: Diagnosis not present

## 2022-08-10 DIAGNOSIS — J324 Chronic pansinusitis: Secondary | ICD-10-CM | POA: Diagnosis not present

## 2022-08-10 DIAGNOSIS — J209 Acute bronchitis, unspecified: Secondary | ICD-10-CM | POA: Diagnosis not present

## 2022-08-13 ENCOUNTER — Other Ambulatory Visit: Payer: Self-pay | Admitting: Family Medicine

## 2022-08-16 ENCOUNTER — Encounter: Payer: Self-pay | Admitting: Family Medicine

## 2022-08-17 ENCOUNTER — Other Ambulatory Visit: Payer: Self-pay

## 2022-08-17 MED ORDER — ZOLPIDEM TARTRATE ER 6.25 MG PO TBCR: 6.2500 mg | EXTENDED_RELEASE_TABLET | Freq: Every evening | ORAL | 5 refills | Status: DC | PRN

## 2022-08-17 NOTE — Telephone Encounter (Signed)
Please resend to pharmacy, pharmacy never received initial refill

## 2022-09-01 DIAGNOSIS — R051 Acute cough: Secondary | ICD-10-CM | POA: Diagnosis not present

## 2022-09-01 DIAGNOSIS — J019 Acute sinusitis, unspecified: Secondary | ICD-10-CM | POA: Diagnosis not present

## 2022-09-01 DIAGNOSIS — R0981 Nasal congestion: Secondary | ICD-10-CM | POA: Diagnosis not present

## 2022-09-24 ENCOUNTER — Other Ambulatory Visit: Payer: Self-pay | Admitting: Family Medicine

## 2022-09-28 DIAGNOSIS — L812 Freckles: Secondary | ICD-10-CM | POA: Diagnosis not present

## 2022-09-28 DIAGNOSIS — L821 Other seborrheic keratosis: Secondary | ICD-10-CM | POA: Diagnosis not present

## 2022-09-28 DIAGNOSIS — D1801 Hemangioma of skin and subcutaneous tissue: Secondary | ICD-10-CM | POA: Diagnosis not present

## 2022-10-20 ENCOUNTER — Other Ambulatory Visit: Payer: Self-pay | Admitting: Family Medicine

## 2022-11-03 ENCOUNTER — Other Ambulatory Visit: Payer: Self-pay | Admitting: Family Medicine

## 2023-01-14 DIAGNOSIS — H26491 Other secondary cataract, right eye: Secondary | ICD-10-CM | POA: Diagnosis not present

## 2023-01-21 ENCOUNTER — Encounter (INDEPENDENT_AMBULATORY_CARE_PROVIDER_SITE_OTHER): Payer: Self-pay

## 2023-01-26 DIAGNOSIS — H26493 Other secondary cataract, bilateral: Secondary | ICD-10-CM | POA: Diagnosis not present

## 2023-02-15 ENCOUNTER — Other Ambulatory Visit: Payer: Self-pay | Admitting: Family Medicine

## 2023-02-25 ENCOUNTER — Encounter: Payer: PRIVATE HEALTH INSURANCE | Admitting: Family Medicine

## 2023-03-08 ENCOUNTER — Ambulatory Visit (INDEPENDENT_AMBULATORY_CARE_PROVIDER_SITE_OTHER): Payer: Medicare PPO | Admitting: Family Medicine

## 2023-03-08 VITALS — BP 129/73 | HR 74 | Temp 98.0°F | Ht 69.0 in | Wt 168.4 lb

## 2023-03-08 DIAGNOSIS — Z0000 Encounter for general adult medical examination without abnormal findings: Secondary | ICD-10-CM | POA: Diagnosis not present

## 2023-03-08 DIAGNOSIS — R739 Hyperglycemia, unspecified: Secondary | ICD-10-CM | POA: Diagnosis not present

## 2023-03-08 DIAGNOSIS — Z23 Encounter for immunization: Secondary | ICD-10-CM | POA: Diagnosis not present

## 2023-03-08 DIAGNOSIS — N529 Male erectile dysfunction, unspecified: Secondary | ICD-10-CM

## 2023-03-08 DIAGNOSIS — E785 Hyperlipidemia, unspecified: Secondary | ICD-10-CM

## 2023-03-08 DIAGNOSIS — R911 Solitary pulmonary nodule: Secondary | ICD-10-CM

## 2023-03-08 DIAGNOSIS — G4700 Insomnia, unspecified: Secondary | ICD-10-CM

## 2023-03-08 DIAGNOSIS — K219 Gastro-esophageal reflux disease without esophagitis: Secondary | ICD-10-CM | POA: Diagnosis not present

## 2023-03-08 DIAGNOSIS — B009 Herpesviral infection, unspecified: Secondary | ICD-10-CM | POA: Insufficient documentation

## 2023-03-08 LAB — CBC WITH DIFFERENTIAL/PLATELET
Basophils Absolute: 0 10*3/uL (ref 0.0–0.1)
Basophils Relative: 0.9 % (ref 0.0–3.0)
Eosinophils Absolute: 0.1 10*3/uL (ref 0.0–0.7)
Eosinophils Relative: 1.3 % (ref 0.0–5.0)
HCT: 47.8 % (ref 39.0–52.0)
Hemoglobin: 15.5 g/dL (ref 13.0–17.0)
Lymphocytes Relative: 40 % (ref 12.0–46.0)
Lymphs Abs: 1.9 10*3/uL (ref 0.7–4.0)
MCHC: 32.5 g/dL (ref 30.0–36.0)
MCV: 93.8 fL (ref 78.0–100.0)
Monocytes Absolute: 0.4 10*3/uL (ref 0.1–1.0)
Monocytes Relative: 8 % (ref 3.0–12.0)
Neutro Abs: 2.4 10*3/uL (ref 1.4–7.7)
Neutrophils Relative %: 49.8 % (ref 43.0–77.0)
Platelets: 206 10*3/uL (ref 150.0–400.0)
RBC: 5.09 Mil/uL (ref 4.22–5.81)
RDW: 13.4 % (ref 11.5–15.5)
WBC: 4.8 10*3/uL (ref 4.0–10.5)

## 2023-03-08 LAB — HEMOGLOBIN A1C: Hgb A1c MFr Bld: 5.6 % (ref 4.6–6.5)

## 2023-03-08 LAB — COMPREHENSIVE METABOLIC PANEL
ALT: 11 U/L (ref 0–53)
AST: 23 U/L (ref 0–37)
Albumin: 4.3 g/dL (ref 3.5–5.2)
Alkaline Phosphatase: 51 U/L (ref 39–117)
BUN: 28 mg/dL — ABNORMAL HIGH (ref 6–23)
CO2: 27 meq/L (ref 19–32)
Calcium: 9.8 mg/dL (ref 8.4–10.5)
Chloride: 107 meq/L (ref 96–112)
Creatinine, Ser: 1.21 mg/dL (ref 0.40–1.50)
GFR: 60.19 mL/min (ref >60.00)
Glucose, Bld: 93 mg/dL (ref 70–99)
Potassium: 3.9 meq/L (ref 3.5–5.1)
Sodium: 142 meq/L (ref 135–145)
Total Bilirubin: 1.3 mg/dL — ABNORMAL HIGH (ref 0.2–1.2)
Total Protein: 6.5 g/dL (ref 6.0–8.3)

## 2023-03-08 LAB — LIPID PANEL
Cholesterol: 209 mg/dL — ABNORMAL HIGH (ref 0–200)
HDL: 92.1 mg/dL (ref >39.00)
LDL Cholesterol: 105 mg/dL — ABNORMAL HIGH (ref 0–99)
NonHDL: 117.26
Total CHOL/HDL Ratio: 2
Triglycerides: 60 mg/dL (ref 0.0–149.0)
VLDL: 12 mg/dL (ref 0.0–40.0)

## 2023-03-08 LAB — PSA: PSA: 1.35 ng/mL (ref 0.10–4.00)

## 2023-03-08 LAB — TSH: TSH: 1.5 u[IU]/mL (ref 0.35–5.50)

## 2023-03-08 MED ORDER — TADALAFIL 20 MG PO TABS: 20.0000 mg | ORAL_TABLET | Freq: Every day | ORAL | 3 refills | Status: DC | PRN

## 2023-03-08 MED ORDER — VALACYCLOVIR HCL 1 G PO TABS: 1000.0000 mg | ORAL_TABLET | Freq: Every day | ORAL | 3 refills | Status: AC

## 2023-03-08 NOTE — Assessment & Plan Note (Signed)
Check A1c.  Discussed lifestyle modifications. °

## 2023-03-08 NOTE — Patient Instructions (Signed)
It was very nice to see you today!  We will check blood work today.  We gave you a flu shot today.  I will plan prescription for your Cialis.  Please try the Valtrex.  Let us know if you have any issues.  Return in about 1 year (around 03/07/2024) for Annual Physical.   Take care, Dr Jimmey Ralph  PLEASE NOTE:  If you had any lab tests, please let us know if you have not heard back within a few days. You may see your results on mychart before we have a chance to review them but we will give you a call once they are reviewed by [place].   If we ordered any referrals today, please let us know if you have not heard from their office within the next week.   If you had any urgent prescriptions sent in today, please check with the pharmacy within an hour of our visit to make sure the prescription was transmitted appropriately.   Please try these tips to maintain a healthy lifestyle:  Eat at least 3 REAL meals and 1-2 snacks per day.  Aim for no more than 5 hours between eating.  If you eat breakfast, please do so within one hour of getting up.   Each meal should contain half fruits/vegetables, one quarter protein, and one quarter carbs (no bigger than a computer mouse)  Cut down on sweet beverages. This includes juice, soda, and sweet tea.   Drink at least 1 glass of water with each meal and aim for at least 8 glasses per day  Exercise at least 150 minutes every week.

## 2023-03-08 NOTE — Assessment & Plan Note (Signed)
Has repeat CT scan next month.

## 2023-03-08 NOTE — Assessment & Plan Note (Signed)
Symptoms stable on omeprazole 20 mg daily.

## 2023-03-08 NOTE — Progress Notes (Signed)
Chief Complaint:  Robert Haden is a 72 y.o. male who presents today for his annual comprehensive physical exam.    Assessment/Plan:  Chronic Problems Addressed Today: GERD Symptoms stable on omeprazole 20 mg daily.  Insomnia Stable on Ambien 6.25 mg nightly.  Does not need refill today.  Tolerating well without any significant side effects.  ED (erectile dysfunction) Stable on tadalafil 20 mg daily as needed.  Will refill today.  Hyperglycemia Check A1c.  Discussed lifestyle modifications.  Dyslipidemia Check lipids.  Discussed lifestyle modifications.  Pulmonary nodule Has repeat CT scan next month.  Herpes On acyclovir as needed for flareups though is interested in possible suppressive therapy.  Will start Valtrex 1000 mg daily for suppression.  Encouraged patient to encourage his wife to discuss with her physician regarding treatment for her outbreaks as well.   Preventative Healthcare: Check labs.  Flu shot given today.  Up-to-date on other vaccines and screenings.  Patient Counseling(The following topics were reviewed and/or handout was given):  -Nutrition: Stressed importance of moderation in sodium/caffeine intake, saturated fat and cholesterol, caloric balance, sufficient intake of fresh fruits, vegetables, and fiber.  -Stressed the importance of regular exercise.   -Substance Abuse: Discussed cessation/primary prevention of tobacco, alcohol, or other drug use; driving or other dangerous activities under the influence; availability of treatment for abuse.   -Injury prevention: Discussed safety belts, safety helmets, smoke detector, smoking near bedding or upholstery.   -Sexuality: Discussed sexually transmitted diseases, partner selection, use of condoms, avoidance of unintended pregnancy and contraceptive alternatives.   -Dental health: Discussed importance of regular tooth brushing, flossing, and dental visits.  -Health maintenance and immunizations reviewed. Please  refer to Health maintenance section.  Return to care in 1 year for next preventative visit.     Subjective:  HPI:  He has no acute complaints today. See assessment / plan for status of chronic conditions.  Lifestyle Diet: Balanced. Plenty of fruits and vegetables.  Exercise: Pickleball 2-3 times per week. Busy with yard work.      04/07/2022   10:54 AM  Depression screen PHQ 2/9  Decreased Interest 0  Down, Depressed, Hopeless 0  PHQ - 2 Score 0    Health Maintenance Due  Topic Date Due   Medicare Annual Wellness (AWV)  12/26/2022     ROS: Per HPI, otherwise a complete review of systems was negative.   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   Patient Active Problem List   Diagnosis Date Noted   Herpes 03/08/2023   Pulmonary nodule 04/07/2022   Stress 02/12/2022   Hyperglycemia 03/25/2020   Dyslipidemia 03/25/2020   History of melanoma 03/24/2019   ED (erectile dysfunction) 11/28/2018   Insomnia 07/18/2018   NEPHROLITHIASIS, HX OF 12/28/2008   GERD 01/28/2007   Past Surgical History:  Procedure Laterality Date   COLONOSCOPY     MELANOMA EXCISION     left cheek/face   NOSE SURGERY     Deviated Septum    Family History  Problem Relation Age of Onset   Colon cancer Mother    Esophageal cancer Neg Hx    Rectal cancer Neg Hx    Stomach cancer Neg Hx  Medications- reviewed and updated Current Outpatient Medications  Medication Sig Dispense Refill   LORazepam (ATIVAN) 0.5 MG tablet TAKE 1 TABLET BY MOUTH TWICE A DAY AS NEEDED FOR ANXIETY 30 tablet 5   Melatonin 10 MG CAPS Take by mouth.     mometasone (ELOCON) 0.1 % ointment SMARTSIG:1 Application In  Ear(s) Twice Daily     Multiple Vitamin (MULTI-DAY VITAMINS PO) Take by mouth as needed.     omeprazole (PRILOSEC) 20 MG capsule Take 1 capsule (20 mg total) by mouth daily. 90 capsule 3   valACYclovir (VALTREX) 1000 MG tablet Take 1 tablet (1,000 mg total) by mouth daily. 90 tablet 3   zolpidem (AMBIEN CR) 6.25 MG CR tablet TAKE 1 TABLET (6.25 MG TOTAL) BY MOUTH AT BEDTIME AS NEEDED. FOR SLEEP 30 tablet 5   tadalafil (CIALIS) 20 MG tablet Take 1 tablet (20 mg total) by mouth daily as needed. 90 tablet 3   No current facility-administered medications for this visit.    Allergies-reviewed and updated Allergies  Allergen Reactions   Tetracycline Hcl     REACTION: blister on genitals    Social History   Socioeconomic History   Marital status: Married    Spouse name: Not on file   Number of children: Not on file   Years of education: Not on file   Highest education level: Not on file  Occupational History   Occupation: Retired  Tobacco Use   Smoking status: Never   Smokeless tobacco: Never  Vaping Use   Vaping status: Never Used  Substance and Sexual Activity   Alcohol use: Yes    Alcohol/week: 3.0 standard drinks of alcohol    Types: 3 Cans of beer per week    Comment: mixture of beer, wine, liquor   Drug use: No   Sexual activity: Not on file  Other Topics Concern   Not on file  Social History Narrative   Single   Gets regular exercise   Social Determinants of Health   Financial Resource Strain: Low Risk  (12/25/2021)   Overall Financial Resource Strain (CARDIA)    Difficulty of Paying Living Expenses: Not hard at all  Food Insecurity: No Food Insecurity (12/25/2021)   Hunger Vital Sign    Worried About Running Out of Food in the Last Year: Never true    Ran Out of Food in the Last Year: Never true  Transportation Needs: No Transportation Needs (12/25/2021)   PRAPARE - Administrator, Civil Service (Medical): No    Lack of Transportation (Non-Medical):  No  Physical Activity: Sufficiently Active (12/25/2021)   Exercise Vital Sign    Days of Exercise per Week: 2 days    Minutes of Exercise per Session: 120 min  Stress: No Stress Concern Present (12/25/2021)   [employer] of Occupational Health - Occupational Stress Questionnaire    Feeling of Stress : Not at all  Social Connections: Moderately Integrated (12/25/2021)   Social Connection and Isolation Panel [NHANES]    Frequency of Communication with Friends and Family: More than three times a week    Frequency of Social Gatherings with Friends and Family: More than three times a week    Attends Religious Services: More than 4 times per year    Active Member of [employer] or Organizations: No    Attends Banker Meetings: Never    Marital Status: Married        Objective:  Physical Exam: BP 129/73 (BP Location: Left Arm,  Patient Position: Sitting, Cuff Size: Normal)   Pulse 74   Temp 98 F (36.7 C) (Temporal)   Ht 5\' 9"  (1.753 m)   Wt 168 lb 6 oz (76.4 kg)   SpO2 97%   BMI 24.86 kg/m   Body mass index is 24.86 kg/m. Wt Readings from Last 3 Encounters:  03/08/23 168 lb 6 oz (76.4 kg)  04/07/22 167 lb (75.8 kg)  02/12/22 164 lb 3.2 oz (74.5 kg)   Gen: NAD, resting comfortably HEENT: TMs normal bilaterally. OP clear. No thyromegaly noted.  CV: RRR with no murmurs appreciated Pulm: NWOB, CTAB with no crackles, wheezes, or rhonchi GI: Normal bowel sounds present. Soft, Nontender, Nondistended. MSK: no edema, cyanosis, or clubbing noted Skin: warm, dry Neuro: CN2-12 grossly intact. Strength 5/5 in upper and lower extremities. Reflexes symmetric and intact bilaterally.  Psych: Normal affect and thought content     Vester Titsworth M. Jimmey Ralph, MD 03/08/2023 10:47 AM

## 2023-03-08 NOTE — Assessment & Plan Note (Signed)
Check lipids. Discussed lifestyle modifications.  

## 2023-03-08 NOTE — Assessment & Plan Note (Signed)
Stable on Ambien 6.25 mg nightly.  Does not need refill today.  Tolerating well without any significant side effects.

## 2023-03-08 NOTE — Assessment & Plan Note (Signed)
On acyclovir as needed for flareups though is interested in possible suppressive therapy.  Will start Valtrex 1000 mg daily for suppression.  Encouraged patient to encourage his wife to discuss with her physician regarding treatment for her outbreaks as well.

## 2023-03-08 NOTE — Assessment & Plan Note (Signed)
Stable on tadalafil 20 mg daily as needed.  Will refill today.

## 2023-03-09 NOTE — Progress Notes (Signed)
His cholesterol is borderline elevated but better than last year.  The rest of his labs are all stable.  He would benefit from starting a low-dose statin to improve his numbers and lower risk of heart attack and stroke.  Please send in Lipitor 20 mg daily if he is agreeable to start.  Regardless, he should continue to work on diet and exercise and we can recheck everything in a year.

## 2023-03-17 ENCOUNTER — Other Ambulatory Visit: Payer: Self-pay | Admitting: *Deleted

## 2023-03-17 MED ORDER — ATORVASTATIN CALCIUM 20 MG PO TABS: 20.0000 mg | ORAL_TABLET | Freq: Every day | ORAL | 1 refills | Status: DC

## 2023-03-30 DIAGNOSIS — D225 Melanocytic nevi of trunk: Secondary | ICD-10-CM | POA: Diagnosis not present

## 2023-03-30 DIAGNOSIS — L820 Inflamed seborrheic keratosis: Secondary | ICD-10-CM | POA: Diagnosis not present

## 2023-03-30 DIAGNOSIS — L821 Other seborrheic keratosis: Secondary | ICD-10-CM | POA: Diagnosis not present

## 2023-03-30 DIAGNOSIS — L812 Freckles: Secondary | ICD-10-CM | POA: Diagnosis not present

## 2023-04-07 ENCOUNTER — Ambulatory Visit
Admission: RE | Admit: 2023-04-07 | Discharge: 2023-04-07 | Disposition: A | Discharge status: Home / Self Care | Payer: Medicare PPO | Source: Ambulatory Visit | Attending: Family Medicine | Admitting: Family Medicine

## 2023-04-07 DIAGNOSIS — R911 Solitary pulmonary nodule: Secondary | ICD-10-CM

## 2023-04-07 DIAGNOSIS — I2510 Atherosclerotic heart disease of native coronary artery without angina pectoris: Secondary | ICD-10-CM | POA: Diagnosis not present

## 2023-04-09 ENCOUNTER — Other Ambulatory Visit: Payer: Self-pay | Admitting: Family Medicine

## 2023-04-11 ENCOUNTER — Other Ambulatory Visit: Payer: Self-pay | Admitting: Family Medicine

## 2023-04-13 DIAGNOSIS — Z135 Encounter for screening for eye and ear disorders: Secondary | ICD-10-CM | POA: Diagnosis not present

## 2023-04-13 DIAGNOSIS — H3561 Retinal hemorrhage, right eye: Secondary | ICD-10-CM | POA: Diagnosis not present

## 2023-04-17 ENCOUNTER — Encounter: Payer: Self-pay | Admitting: Family Medicine

## 2023-04-20 NOTE — Telephone Encounter (Signed)
We are still waiting on radiology to read his CT scan.  Katina Degree. Jimmey Ralph, MD 04/20/2023 8:25 AM

## 2023-04-22 ENCOUNTER — Encounter: Payer: Self-pay | Admitting: Family Medicine

## 2023-04-22 ENCOUNTER — Ambulatory Visit: Payer: Medicare PPO | Admitting: Family Medicine

## 2023-04-22 ENCOUNTER — Ambulatory Visit
Admission: RE | Admit: 2023-04-22 | Discharge: 2023-04-22 | Disposition: A | Discharge status: Home / Self Care | Payer: Medicare PPO | Source: Ambulatory Visit | Attending: Family Medicine | Admitting: Family Medicine

## 2023-04-22 ENCOUNTER — Other Ambulatory Visit: Payer: Self-pay | Admitting: Family Medicine

## 2023-04-22 VITALS — BP 132/84 | Ht 69.0 in | Wt 165.0 lb

## 2023-04-22 DIAGNOSIS — M25562 Pain in left knee: Secondary | ICD-10-CM

## 2023-04-22 DIAGNOSIS — M25561 Pain in right knee: Secondary | ICD-10-CM | POA: Diagnosis not present

## 2023-04-22 DIAGNOSIS — M170 Bilateral primary osteoarthritis of knee: Secondary | ICD-10-CM | POA: Diagnosis not present

## 2023-04-22 MED ORDER — METHYLPREDNISOLONE ACETATE 40 MG/ML IJ SUSP
40.0000 mg | Freq: Once | INTRAMUSCULAR | Status: AC
  Administered 2023-04-22: 40 mg via INTRA_ARTICULAR

## 2023-04-22 NOTE — Progress Notes (Signed)
PCP: Ardith Dark, MD  Subjective:  CC: Lt knee pain  HPI: Patient is a 72 y.o. male here for evaluation of left knee pain.  Previously seen 11/24/2017 and 09/27/2022 for right knee pain.  Tried multiple conservative measures at that time but was ultimately given steroid injection as well as home exercises.  Today, he states his left knee has been hurting for the last week.  He has been very active recently playing pickle ball and working at home.  The pain is mostly in the back of his knee and over towards the inside of his knee.  It hurts worse whenever he extends and flexes fully.  It feels like there is fluid in the knee.  No erythema, warmth, external swelling.  No fevers.  Does have a history of steroid injection in bilateral shoulders as well for arthritis.  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     mometasone (ELOCON) 0.1 % ointment SMARTSIG:1 Application In Ear(s) Twice Daily     Multiple Vitamin (MULTI-DAY VITAMINS PO) Take by mouth as needed.     omeprazole (PRILOSEC) 20 MG capsule TAKE 1 CAPSULE BY MOUTH EVERY DAY 90 capsule 3   tadalafil (CIALIS) 20 MG tablet Take 1 tablet (20 mg total) by mouth daily as needed. 90 tablet 3   valACYclovir (VALTREX) 1000 MG tablet Take 1 tablet (1,000 mg total) by mouth daily. 90 tablet 3   zolpidem (AMBIEN CR) 6.25 MG CR tablet TAKE 1 TABLET (6.25 MG TOTAL) BY MOUTH AT BEDTIME AS NEEDED. FOR SLEEP 30  tablet 5   No current facility-administered medications on file prior to visit.    Past Surgical History:  Procedure Laterality Date   COLONOSCOPY     MELANOMA EXCISION     left cheek/face   NOSE SURGERY     Deviated Septum    Allergies  Allergen Reactions   Tetracycline Hcl     REACTION: blister on genitals    There were no vitals taken for this visit.      No data to display              No data to display              Objective:  Physical Exam:  Gen: NAD, comfortable in exam room  Left knee: No gross deformity, ecchymoses, swelling. TTP over posterior medial joint line and popliteal fossa. No palpable Baker's cyst FROM with normal strength. Negative ant/post drawers. Negative varus testing. Negative lachman.  Positive valgus testing with pain and no laxity. Negative Mcmurrays, Apleys, Thessalys. Gait: walking with slight limp NV intact distally.   Assessment & Plan:  1.  Left knee pain: Most likely due to osteoarthritis given history and symptoms.  Could also have degenerative meniscus contributing. Reassured by lack of systemic symptoms.  - Will obtain bilateral knee x-rays to further characterize degree  of OA - Discussed steroid injection for acute pain which patient desired.  See Procedure Note below - Cont knee sleeve with activity - Follow-up in 6 weeks or as needed. If not improved, consider physical therapy, HA, or other treatment  PROCEDURE:  Risks & benefits of left knee cortisone injection reviewed. Consent obtained. Time-out completed. Patient prepped and draped in the normal fashion. Area cleansed with alcohol. Ethyl chloride spray used to anesthetize the skin. Solution of 4 mL 1% lidocaine with 1 mL methylprednisolone (Depo-medrol) 40mg /mL injected into the left knee using a 25-gauge 1.5-inch needle via the anterior medial approach. Patient tolerated procedure well without any complications. Area covered with adhesive bandage.  Post-procedure  care reviewed, all questions answered.   Janeal Holmes, MD PGY-2, [facility]  Addendum:  Patient seen and examined in the office with resident - Burna Forts, MD, PGY-2.  History, exam, plan of care were precepted with me.  I was present and assisted with procedure.  Agree with findings and plan as documented in resident note with additions made above.  Darene Lamer, DO, CAQSM

## 2023-04-26 NOTE — Progress Notes (Signed)
His CT scan showed a stable nodule.  We do not need to check any further CT scans for this.  His CT scan also did show some calcifications in the arteries in his heart which is consistent with the CT scan that he had last year.  Do not need to make any changes to his treatment plan at this time.  We should continue to work on risk factor modification with diet and exercise as well as management for his blood pressure, blood sugar, and cholesterol.

## 2023-04-26 NOTE — Telephone Encounter (Signed)
See results note. 

## 2023-05-10 NOTE — Progress Notes (Signed)
Images reviewed for Rt and Lt knee showing mild to moderate OA and chondrocalcinosis on the right and mild OA and chondrocalcinosis on the left.  Message sent to patient.  Cont current tx.

## 2023-05-21 DIAGNOSIS — B009 Herpesviral infection, unspecified: Secondary | ICD-10-CM | POA: Diagnosis not present

## 2023-05-21 DIAGNOSIS — L309 Dermatitis, unspecified: Secondary | ICD-10-CM | POA: Diagnosis not present

## 2023-05-21 DIAGNOSIS — I10 Essential (primary) hypertension: Secondary | ICD-10-CM | POA: Diagnosis not present

## 2023-05-21 DIAGNOSIS — Z818 Family history of other mental and behavioral disorders: Secondary | ICD-10-CM | POA: Diagnosis not present

## 2023-05-21 DIAGNOSIS — E785 Hyperlipidemia, unspecified: Secondary | ICD-10-CM | POA: Diagnosis not present

## 2023-05-21 DIAGNOSIS — I2510 Atherosclerotic heart disease of native coronary artery without angina pectoris: Secondary | ICD-10-CM | POA: Diagnosis not present

## 2023-05-21 DIAGNOSIS — K219 Gastro-esophageal reflux disease without esophagitis: Secondary | ICD-10-CM | POA: Diagnosis not present

## 2023-05-21 DIAGNOSIS — G4700 Insomnia, unspecified: Secondary | ICD-10-CM | POA: Diagnosis not present

## 2023-05-21 DIAGNOSIS — Z791 Long term (current) use of non-steroidal anti-inflammatories (NSAID): Secondary | ICD-10-CM | POA: Diagnosis not present

## 2023-06-15 ENCOUNTER — Ambulatory Visit: Payer: Medicare PPO | Admitting: Family Medicine

## 2023-08-24 ENCOUNTER — Other Ambulatory Visit: Payer: Self-pay | Admitting: Family Medicine

## 2023-09-05 ENCOUNTER — Other Ambulatory Visit: Payer: Self-pay | Admitting: Family Medicine

## 2023-09-06 ENCOUNTER — Encounter: Payer: Self-pay | Admitting: Family Medicine

## 2023-09-29 DIAGNOSIS — D2272 Melanocytic nevi of left lower limb, including hip: Secondary | ICD-10-CM | POA: Diagnosis not present

## 2023-09-29 DIAGNOSIS — L812 Freckles: Secondary | ICD-10-CM | POA: Diagnosis not present

## 2023-09-29 DIAGNOSIS — D485 Neoplasm of uncertain behavior of skin: Secondary | ICD-10-CM | POA: Diagnosis not present

## 2023-09-29 DIAGNOSIS — L578 Other skin changes due to chronic exposure to nonionizing radiation: Secondary | ICD-10-CM | POA: Diagnosis not present

## 2023-09-29 DIAGNOSIS — C44319 Basal cell carcinoma of skin of other parts of face: Secondary | ICD-10-CM | POA: Diagnosis not present

## 2023-09-29 DIAGNOSIS — L821 Other seborrheic keratosis: Secondary | ICD-10-CM | POA: Diagnosis not present

## 2023-09-29 DIAGNOSIS — C44311 Basal cell carcinoma of skin of nose: Secondary | ICD-10-CM | POA: Diagnosis not present

## 2023-10-22 ENCOUNTER — Other Ambulatory Visit: Payer: Self-pay | Admitting: Family Medicine

## 2023-11-02 DIAGNOSIS — Z85828 Personal history of other malignant neoplasm of skin: Secondary | ICD-10-CM | POA: Diagnosis not present

## 2023-11-02 DIAGNOSIS — C44311 Basal cell carcinoma of skin of nose: Secondary | ICD-10-CM | POA: Diagnosis not present

## 2023-11-12 ENCOUNTER — Telehealth: Payer: Self-pay

## 2023-11-12 NOTE — Telephone Encounter (Signed)
 Error

## 2024-01-12 ENCOUNTER — Encounter: Payer: Self-pay | Admitting: Family Medicine

## 2024-01-13 MED ORDER — ZOLPIDEM TARTRATE ER 6.25 MG PO TBCR: 6.2500 mg | EXTENDED_RELEASE_TABLET | Freq: Every evening | ORAL | 5 refills | Status: DC | PRN

## 2024-01-14 ENCOUNTER — Other Ambulatory Visit: Payer: Self-pay | Admitting: *Deleted

## 2024-01-14 NOTE — Telephone Encounter (Signed)
 Pharmacy requesting Diagnosis  code on copy

## 2024-01-17 MED ORDER — ZOLPIDEM TARTRATE ER 6.25 MG PO TBCR: 6.2500 mg | EXTENDED_RELEASE_TABLET | Freq: Every evening | ORAL | 5 refills | Status: DC | PRN

## 2024-02-29 ENCOUNTER — Other Ambulatory Visit: Payer: Self-pay | Admitting: Family Medicine

## 2024-03-06 ENCOUNTER — Encounter: Payer: Self-pay | Admitting: Family Medicine

## 2024-03-06 MED ORDER — LORAZEPAM 0.5 MG PO TABS: 0.5000 mg | ORAL_TABLET | Freq: Two times a day (BID) | ORAL | 5 refills | Status: AC | PRN

## 2024-03-06 NOTE — Telephone Encounter (Signed)
 Prescription sent in.  [facility]. Kennyth, MD 03/06/2024 8:33 AM

## 2024-03-06 NOTE — Addendum Note (Signed)
 Addended by: Zaya Kessenich M on: 03/06/2024 08:32 AM   Modules accepted: Orders

## 2024-03-06 NOTE — Telephone Encounter (Signed)
I sent this in this morning  ?

## 2024-03-06 NOTE — Telephone Encounter (Signed)
 Patient need New prescription with Dx code

## 2024-03-23 ENCOUNTER — Ambulatory Visit: Admitting: Family Medicine

## 2024-03-23 ENCOUNTER — Encounter: Payer: Self-pay | Admitting: Family Medicine

## 2024-03-23 VITALS — BP 114/64 | HR 74 | Temp 97.8°F | Ht 69.0 in | Wt 167.2 lb

## 2024-03-23 DIAGNOSIS — R739 Hyperglycemia, unspecified: Secondary | ICD-10-CM | POA: Diagnosis not present

## 2024-03-23 DIAGNOSIS — Z23 Encounter for immunization: Secondary | ICD-10-CM | POA: Diagnosis not present

## 2024-03-23 DIAGNOSIS — N529 Male erectile dysfunction, unspecified: Secondary | ICD-10-CM

## 2024-03-23 DIAGNOSIS — G4700 Insomnia, unspecified: Secondary | ICD-10-CM

## 2024-03-23 DIAGNOSIS — Z125 Encounter for screening for malignant neoplasm of prostate: Secondary | ICD-10-CM

## 2024-03-23 DIAGNOSIS — F439 Reaction to severe stress, unspecified: Secondary | ICD-10-CM | POA: Diagnosis not present

## 2024-03-23 DIAGNOSIS — Z0001 Encounter for general adult medical examination with abnormal findings: Secondary | ICD-10-CM

## 2024-03-23 DIAGNOSIS — E785 Hyperlipidemia, unspecified: Secondary | ICD-10-CM

## 2024-03-23 LAB — LIPID PANEL
Cholesterol: 150 mg/dL (ref 0–200)
HDL: 78.3 mg/dL (ref >39.00)
LDL Cholesterol: 62 mg/dL (ref 0–99)
NonHDL: 71.3
Total CHOL/HDL Ratio: 2
Triglycerides: 48 mg/dL (ref 0.0–149.0)
VLDL: 9.6 mg/dL (ref 0.0–40.0)

## 2024-03-23 LAB — COMPREHENSIVE METABOLIC PANEL WITH GFR
ALT: 17 U/L (ref 0–53)
AST: 41 U/L — ABNORMAL HIGH (ref 0–37)
Albumin: 4.6 g/dL (ref 3.5–5.2)
Alkaline Phosphatase: 59 U/L (ref 39–117)
BUN: 29 mg/dL — ABNORMAL HIGH (ref 6–23)
CO2: 28 meq/L (ref 19–32)
Calcium: 10.1 mg/dL (ref 8.4–10.5)
Chloride: 108 meq/L (ref 96–112)
Creatinine, Ser: 0.94 mg/dL (ref 0.40–1.50)
GFR: 80.9 mL/min (ref >60.00)
Glucose, Bld: 99 mg/dL (ref 70–99)
Potassium: 4.8 meq/L (ref 3.5–5.1)
Sodium: 143 meq/L (ref 135–145)
Total Bilirubin: 1.2 mg/dL (ref 0.2–1.2)
Total Protein: 6.7 g/dL (ref 6.0–8.3)

## 2024-03-23 LAB — CBC
HCT: 44.6 % (ref 39.0–52.0)
Hemoglobin: 15 g/dL (ref 13.0–17.0)
MCHC: 33.6 g/dL (ref 30.0–36.0)
MCV: 91.7 fl (ref 78.0–100.0)
Platelets: 206 K/uL (ref 150.0–400.0)
RBC: 4.86 Mil/uL (ref 4.22–5.81)
RDW: 13.2 % (ref 11.5–15.5)
WBC: 5.2 K/uL (ref 4.0–10.5)

## 2024-03-23 LAB — HEMOGLOBIN A1C: Hgb A1c MFr Bld: 5.7 % (ref 4.6–6.5)

## 2024-03-23 LAB — TSH: TSH: 1.74 u[IU]/mL (ref 0.35–5.50)

## 2024-03-23 LAB — PSA: PSA: 1.71 ng/mL (ref 0.10–4.00)

## 2024-03-23 MED ORDER — TADALAFIL 20 MG PO TABS: 20.0000 mg | ORAL_TABLET | Freq: Every day | ORAL | 3 refills | Status: AC | PRN

## 2024-03-23 NOTE — Assessment & Plan Note (Signed)
 Overall symptoms are manageable.  Has lorazepam  to use as needed though uses this sporadically.  Does not need refill on this today.

## 2024-03-23 NOTE — Progress Notes (Signed)
 Chief Complaint:  Robert Cowan is a 73 y.o. male who presents today for his annual comprehensive physical exam.    Assessment/Plan:  Chronic Problems Addressed Today: ED (erectile dysfunction) Stable on tadalafil  20 mg daily as needed.  Will refill today.  Dyslipidemia Doing well with Lipitor 20 mg daily.  Check lipids today.  Hyperglycemia Check A1c with labs.  Insomnia Stable on Ambien  6.25 mg nightly as needed.  Does not need refill today.  Stress Overall symptoms are manageable.  Has lorazepam  to use as needed though uses this sporadically.  Does not need refill on this today.  Right Ear Fullness Symptoms have resolved.  Normal exam today.  We did discuss hearing test versus audiology referral however he declined.  He will let us  know if he changes his mind.  Preventative Healthcare: Flu shot given today. Check labs.  Up-to-date on colon cancer screening.  Up-to-date on other vaccines.  Patient Counseling(The following topics were reviewed and/or handout was given):  -Nutrition: Stressed importance of moderation in sodium/caffeine intake, saturated fat and cholesterol, caloric balance, sufficient intake of fresh fruits, vegetables, and fiber.  -Stressed the importance of regular exercise.   -Substance Abuse: Discussed cessation/primary prevention of tobacco, alcohol, or other drug use; driving or other dangerous activities under the influence; availability of treatment for abuse.   -Injury prevention: Discussed safety belts, safety helmets, smoke detector, smoking near bedding or upholstery.   -Sexuality: Discussed sexually transmitted diseases, partner selection, use of condoms, avoidance of unintended pregnancy and contraceptive alternatives.   -Dental health: Discussed importance of regular tooth brushing, flossing, and dental visits.  -Health maintenance and immunizations reviewed. Please refer to Health maintenance section.  Return to care in 1 year for next  preventative visit.     Subjective:  HPI:  He has no acute complaints today. Patient is here today for his annual physical.  See assessment / plan for status of chronic conditions.  Discussed the use of AI scribe software for clinical note transcription with the patient, who gave verbal consent to proceed.  History of Present Illness Robert Cowan is a 73 year old male who presents for an annual physical exam.  He experienced a recent episode of right ear fullness, similar to the sensation during flying, while leaf blowing. The sensation lasted for a couple of hours without any pain and resolved spontaneously. He is concerned about potential damage to his ear. 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  He inquires about the use of finasteride for hair loss, noting his son's positive experience with the medication. He is curious if it would be beneficial for him, although he acknowledges that his hair loss might be a 'lost cause'.  He is currently on a statin, which he started last year, and reports no significant side effects. He mentions that his wife experienced difficulty with statins and has switched to a different medication.  He requests a blood sample to check his PSA levels, expressing concern about this particular test. He notes that his previous PSA numbers have been satisfactory.  He takes Cialis  and requires a hard copy prescription to mail to [place], although he is considering checking local prices at [facility]. He mentions using [facility] previously due to cost differences.  He takes lorazepam  for anxiety and discusses issues with prescription refills due to insurance constraints. He experiences anxiety when there is a gap in medication availability and  seeks a solution to prevent this gap.  He engages in regular physical activity, playing pickle ball three  times a week for two and a half hours and performing daily yard work. He mentions having some acreage, which requires year-round maintenance.    Lifestyle Diet: Balanced.  Plenty fruits and veggies. Exercise: Very active at with yard work and plays pickle ball routinely     03/23/2024   11:05 AM  Depression screen PHQ 2/9  Decreased Interest 0  Down, Depressed, Hopeless 0  PHQ - 2 Score 0    Health Maintenance Due  Topic Date Due   Medicare Annual Wellness (AWV)  12/26/2022   Influenza Vaccine  01/07/2024     ROS: Per HPI, otherwise a complete review of systems was negative.   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   Patient Active Problem List   Diagnosis Date Noted   Herpes 03/08/2023   Pulmonary nodule 04/07/2022   Stress 02/12/2022   Hyperglycemia 03/25/2020   Dyslipidemia 03/25/2020   History of melanoma 03/24/2019   ED (erectile dysfunction) 11/28/2018   Insomnia 07/18/2018   NEPHROLITHIASIS, HX OF 12/28/2008   GERD 01/28/2007   Past Surgical History:  Procedure Laterality Date   COLONOSCOPY     MELANOMA EXCISION     left cheek/face   NOSE SURGERY     Deviated Septum    Family History  Problem Relation Age of Onset   Colon cancer Mother    Esophageal cancer Neg Hx    Rectal cancer Neg Hx    Stomach cancer Neg Hx     Medications- reviewed and updated Current Outpatient Medications  Medication Sig Dispense Refill   atorvastatin  (LIPITOR) 20 MG tablet TAKE 1 TABLET BY MOUTH EVERY DAY 90 tablet 1   LORazepam  (ATIVAN ) 0.5 MG tablet Take 1 tablet (0.5 mg total) by mouth 2 (two) times daily as needed for anxiety. 30 tablet 5   Melatonin 10 MG CAPS Take by  mouth.     mometasone (ELOCON) 0.1 % ointment SMARTSIG:1 Application In Ear(s) Twice Daily     Multiple Vitamin (MULTI-DAY VITAMINS PO) Take by mouth as needed.     omeprazole  (PRILOSEC) 20 MG capsule TAKE 1 CAPSULE BY MOUTH EVERY DAY 90 capsule 3   valACYclovir  (VALTREX ) 1000 MG tablet Take 1 tablet (1,000 mg total) by mouth daily. 90 tablet 3   zolpidem  (AMBIEN  CR) 6.25 MG CR tablet Take 1 tablet (6.25 mg total) by mouth at bedtime as needed. for sleep Code G47.00 30 tablet 5   tadalafil  (CIALIS ) 20 MG tablet Take 1 tablet (20 mg total) by mouth daily as needed. 90 tablet 3   No current facility-administered medications for this visit.    Allergies-reviewed and updated Allergies  Allergen Reactions   Tetracycline Hcl     REACTION: blister on genitals    Social History   Socioeconomic History   Marital status: Married    Spouse name: Not on file   Number of children: Not on file   Years of education: Not on file   Highest education level: Professional school degree (e.g., MD, DDS, DVM, JD)  Occupational History   Occupation: Retired  Tobacco Use  Smoking status: Never   Smokeless tobacco: Never  Vaping Use   Vaping status: Never Used  Substance and Sexual Activity   Alcohol use: Yes    Alcohol/week: 3.0 standard drinks of alcohol    Types: 3 Cans of beer per week    Comment: mixture of beer, wine, liquor   Drug use: No   Sexual activity: Not on file  Other Topics Concern   Not on file  Social History Narrative   Single   Gets regular exercise   Social Drivers of Health   Financial Resource Strain: Low Risk  (03/22/2024)   Overall Financial Resource Strain (CARDIA)    Difficulty of Paying Living Expenses: Not very hard  Food Insecurity: No Food Insecurity (03/22/2024)   Hunger Vital Sign    Worried About Running Out of Food in the Last Year: Never true    Ran Out of Food in the Last Year: Never true  Transportation Needs: No Transportation Needs (03/22/2024)    PRAPARE - Administrator, Civil Service (Medical): No    Lack of Transportation (Non-Medical): No  Physical Activity: Sufficiently Active (03/22/2024)   Exercise Vital Sign    Days of Exercise per Week: 7 days    Minutes of Exercise per Session: 150+ min  Stress: Stress Concern Present (03/22/2024)   Robert Cowan    Feeling of Stress: To some extent  Social Connections: Socially Integrated (03/22/2024)   Social Connection and Isolation Panel    Frequency of Communication with Friends and Family: Three times a week    Frequency of Social Gatherings with Friends and Family: Three times a week    Attends Religious Services: More than 4 times per year    Active Member of Clubs or Organizations: Yes    Attends Engineer, structural: More than 4 times per year    Marital Status: Married        Objective:  Physical Exam: BP 114/64   Pulse 74   Temp 97.8 F (36.6 C) (Temporal)   Ht 5' 9 (1.753 m)   Wt 167 lb 3.2 oz (75.8 kg)   SpO2 98%   BMI 24.69 kg/m   Body mass index is 24.69 kg/m. Wt Readings from Last 3 Encounters:  03/23/24 167 lb 3.2 oz (75.8 kg)  04/22/23 165 lb (74.8 kg)  03/08/23 168 lb 6 oz (76.4 kg)   Gen: NAD, resting comfortably HEENT: TMs normal bilaterally. OP clear. No thyromegaly noted.  CV: RRR with no murmurs appreciated Pulm: NWOB, CTAB with no crackles, wheezes, or rhonchi GI: Normal bowel sounds present. Soft, Nontender, Nondistended. MSK: no edema, cyanosis, or clubbing noted Skin: warm, dry Neuro: CN2-12 grossly intact. Strength 5/5 in upper and lower extremities. Reflexes symmetric and intact bilaterally. Rinne and Weber test normal bilaterally. Psych: Normal affect and thought content     Lenon Kuennen M. Kennyth, MD 03/23/2024 11:46 AM

## 2024-03-23 NOTE — Assessment & Plan Note (Signed)
 Stable on Ambien  6.25 mg nightly as needed.  Does not need refill today.

## 2024-03-23 NOTE — Assessment & Plan Note (Signed)
 Doing well with Lipitor 20 mg daily.  Check lipids today.

## 2024-03-23 NOTE — Assessment & Plan Note (Signed)
Check A1c with labs. 

## 2024-03-23 NOTE — Patient Instructions (Signed)
 It was very nice to see you today!  VISIT SUMMARY: Today, you had your annual physical exam. We discussed your recent ear fullness, hair loss concerns, medication refills, and general health maintenance. Blood work was ordered, and you received a flu shot.  YOUR PLAN: ADULT WELLNESS VISIT: Annual wellness visit with no significant updates since the last visit. -Perform blood work to check A1c, cholesterol, blood counts, kidney function, and PSA. -Administer flu shot.  ERECTILE DYSFUNCTION: You requested a hard copy prescription for Cialis . -Provide the prescription for Cialis . -Compare prices at [facility] and local pharmacies using [facility].  HYPERLIPIDEMIA: You are on statin therapy with no significant side effects reported. -Continue current statin therapy.  ANXIETY: You experience anxiety and use Raspan for management. There is an issue with a prescription gap due to insurance limitations. -Schedule the next annual visit for March 24, 2025. -Set a reminder to request a Raspan refill in six months.  HEARING LOSS WITH TINNITUS: You experience intermittent right ear fullness after leaf blowing, which resolves spontaneously. 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 -Advise the use of ear plugs during loud activities.  GENERAL HEALTH MAINTENANCE: General health maintenance was discussed. All vaccinations are up to date, and a colonoscopy is not due for another two years. -Administer flu shot.  Return in about 1 year (around 03/23/2025) for Annual Physical.   Take care, Dr Robert Cowan  PLEASE NOTE:  If you had any lab tests, please let us  know if you have not heard back within a few days. You may see your results on mychart before we have a chance to review them but we will give you a call once they are reviewed by us .   If we ordered any referrals today, please let us  know if you have not heard from their office within the next week.   If you had any urgent  prescriptions sent in today, please check with the pharmacy within an hour of our visit to make sure the prescription was transmitted appropriately.   Please try these tips to maintain a healthy lifestyle:  Eat at least 3 REAL meals and 1-2 snacks per day.  Aim for no more than 5 hours between eating.  If you eat breakfast, please do so within one hour of getting up.   Each meal should contain half fruits/vegetables, one quarter protein, and one quarter carbs (no bigger than a computer mouse)  Cut down on sweet beverages. This includes juice, soda, and sweet tea.   Drink at least 1 glass of water with each meal and aim for at least 8 glasses per day  Exercise at least 150 minutes every week.    Preventive Care 60 Years and Older, Male Preventive care refers to lifestyle choices and visits with your health care provider that can promote health and wellness. Preventive care visits are also called wellness exams. What can I expect for my preventive care visit? Counseling During your preventive care visit, your health care provider may ask about your: Medical history, including: Past medical problems. Family medical history. History of falls. Current health, including: Emotional well-being. Home life and relationship well-being. Sexual activity. Memory and ability to understand (cognition). Lifestyle, including: Alcohol, nicotine or tobacco, and drug use. Access to firearms. Diet, exercise, and sleep habits. Work and work Astronomer. Sunscreen use. Safety issues such as seatbelt and bike helmet use. Physical exam Your health care provider will check your: Height and weight. These may be used to calculate  your BMI (body mass index). BMI is a measurement that tells if you are at a healthy weight. Waist circumference. This measures the distance around your waistline. This measurement also tells if you are at a healthy weight and may help predict your risk of certain diseases, such as  type 2 diabetes and high blood pressure. Heart rate and blood pressure. Body temperature. Skin for abnormal spots. What immunizations do I need?  Vaccines are usually given at various ages, according to a schedule. Your health care provider will recommend vaccines for you based on your age, medical history, and lifestyle or other factors, such as travel or where you work. What tests do I need? Screening Your health care provider may recommend screening tests for certain conditions. This may include: Lipid and cholesterol levels. Diabetes screening. This is done by checking your blood sugar (glucose) after you have not eaten for a while (fasting). Hepatitis C test. Hepatitis B test. HIV (human immunodeficiency virus) test. STI (sexually transmitted infection) testing, if you are at risk. Lung cancer screening. Colorectal cancer screening. Prostate cancer screening. Abdominal aortic aneurysm (AAA) screening. You may need this if you are a current or former smoker. Talk with your health care provider about your test results, treatment options, and if necessary, the need for more tests. Follow these instructions at home: Eating and drinking  Eat a diet that includes fresh fruits and vegetables, whole grains, lean protein, and low-fat dairy products. Limit your intake of foods with high amounts of sugar, saturated fats, and salt. Take vitamin and mineral supplements as recommended by your health care provider. Do not drink alcohol if your health care provider tells you not to drink. If you drink alcohol: Limit how much you have to 0-2 drinks a day. Know how much alcohol is in your drink. In the [place] one drink equals one 12 oz bottle of beer (355 mL), one 5 oz glass of wine (148 mL), or one 1 oz glass of hard liquor (44 mL). Lifestyle Brush your teeth every morning and night with fluoride toothpaste. Floss one time each day. Exercise for at least 30 minutes 5 or more days each week. Do  not use any products that contain nicotine or tobacco. These products include cigarettes, chewing tobacco, and vaping devices, such as e-cigarettes. If you need help quitting, ask your health care provider. Do not use drugs. If you are sexually active, practice safe sex. Use a condom or other form of protection to prevent STIs. Take aspirin only as told by your health care provider. Make sure that you understand how much to take and what form to take. Work with your health care provider to find out whether it is safe and beneficial for you to take aspirin daily. Ask your health care provider if you need to take a cholesterol-lowering medicine (statin). Find healthy ways to manage stress, such as: Meditation, yoga, or listening to music. Journaling. Talking to a trusted person. Spending time with friends and family. Safety Always wear your seat belt while driving or riding in a vehicle. Do not drive: If you have been drinking alcohol. Do not ride with someone who has been drinking. When you are tired or distracted. While texting. If you have been using any mind-altering substances or drugs. Wear a helmet and other protective equipment during sports activities. If you have firearms in your house, make sure you follow all gun safety procedures. Minimize exposure to UV radiation to reduce your risk of skin cancer. What's next?  Visit your health care provider once a year for an annual wellness visit. Ask your health care provider how often you should have your eyes and teeth checked. Stay up to date on all vaccines. This information is not intended to replace advice given to you by your health care provider. Make sure you discuss any questions you have with your health care provider. Document Revised: 11/20/2020 Document Reviewed: 11/20/2020 Elsevier Patient Education  2024 [employer].

## 2024-03-23 NOTE — Assessment & Plan Note (Signed)
Stable on tadalafil 20 mg daily as needed.  Will refill today.

## 2024-03-24 ENCOUNTER — Ambulatory Visit: Payer: Self-pay | Admitting: Family Medicine

## 2024-03-24 DIAGNOSIS — R748 Abnormal levels of other serum enzymes: Secondary | ICD-10-CM

## 2024-03-24 NOTE — Progress Notes (Signed)
 Cholesterol is much better than last year.  He should keep up the great work and we can recheck this again in a year  One of his liver numbers was very mildly elevated.  It looks like he may be also slightly dehydrated.  Recommend he increase his fluid intake and come back here and recheck be met in 1 to 2 weeks.  The rest of his labs are all stable and we can recheck everything else in a year or so.

## 2024-03-28 ENCOUNTER — Other Ambulatory Visit: Payer: Self-pay | Admitting: Family Medicine

## 2024-03-29 ENCOUNTER — Ambulatory Visit (INDEPENDENT_AMBULATORY_CARE_PROVIDER_SITE_OTHER)

## 2024-03-29 VITALS — Ht 69.0 in | Wt 167.0 lb

## 2024-03-29 DIAGNOSIS — Z0000 Encounter for general adult medical examination without abnormal findings: Secondary | ICD-10-CM

## 2024-03-29 NOTE — Patient Instructions (Signed)
 Robert Cowan,  Thank you for taking the time for your Medicare Wellness Visit. I appreciate your continued commitment to your health goals. Please review the care plan we discussed, and feel free to reach out if I can assist you further.  Medicare recommends these wellness visits once per year to help you and your care team stay ahead of potential health issues. These visits are designed to focus on prevention, allowing your provider to concentrate on managing your acute and chronic conditions during your regular appointments.  Please note that Annual Wellness Visits do not include a physical exam. Some assessments may be limited, especially if the visit was conducted virtually. If needed, we may recommend a separate in-person follow-up with your provider.  Ongoing Care Seeing your primary care provider every 3 to 6 months helps us  monitor your health and provide consistent, personalized care.   Referrals If a referral was made during today's visit and you haven't received any updates within two weeks, please contact the referred provider directly to check on the status.  Recommended Screenings:  Health Maintenance  Topic Date Due   Medicare Annual Wellness Visit  12/26/2022   COVID-19 Vaccine (8 - 2025-26 season) 04/08/2024*   Colon Cancer Screening  05/21/2026   DTaP/Tdap/Td vaccine (4 - Tdap) 12/07/2030   Pneumococcal Vaccine for age over 75  Completed   Flu Shot  Completed   Hepatitis C Screening  Completed   Zoster (Shingles) Vaccine  Completed   Meningitis B Vaccine  Aged Out  *Topic was postponed. The date shown is not the original due date.       03/29/2024    1:10 PM  Advanced Directives  Does Patient Have a Medical Advance Directive? No  Would patient like information on creating a medical advance directive? No - Patient declined   Advance Care Planning is important because it: Ensures you receive medical care that aligns with your values, goals, and  preferences. Provides guidance to your family and loved ones, reducing the emotional burden of decision-making during critical moments.  Vision: Annual vision screenings are recommended for early detection of glaucoma, cataracts, and diabetic retinopathy. These exams can also reveal signs of chronic conditions such as diabetes and high blood pressure.  Dental: Annual dental screenings help detect early signs of oral cancer, gum disease, and other conditions linked to overall health, including heart disease and diabetes.  Please see the attached documents for additional preventive care recommendations.

## 2024-03-29 NOTE — Progress Notes (Signed)
 Subjective:   Robert Cowan is a 73 y.o. who presents for a Medicare Wellness preventive visit.  As a reminder, Annual Wellness Visits don't include a physical exam, and some assessments may be limited, especially if this visit is performed virtually. We may recommend an in-person follow-up visit with your provider if needed.  Visit Complete: Virtual I connected with  Robert Cowan on 03/29/24 by a audio enabled telemedicine application and verified that I am speaking with the correct person using two identifiers.  Patient Location: Home  Provider Location: Home Office  I discussed the limitations of evaluation and management by telemedicine. The patient expressed understanding and agreed to proceed.  Vital Signs: Because this visit was a virtual/telehealth visit, some criteria may be missing or patient reported. Any vitals not documented were not able to be obtained and vitals that have been documented are patient reported.  VideoDeclined- This patient declined Librarian, academic. Therefore the visit was completed with audio only.  Persons Participating in Visit: Patient.  AWV Questionnaire: Yes: Patient Medicare AWV questionnaire was completed by the patient on 03/28/24; I have confirmed that all information answered by patient is correct and no changes since this date.  Cardiac Risk Factors include: advanced age (>14men, >40 women);dyslipidemia;male gender     Objective:    Today's Vitals   03/29/24 1307  Weight: 167 lb (75.8 kg)  Height: 5' 9 (1.753 m)   Body mass index is 24.66 kg/m.     03/29/2024    1:10 PM 12/25/2021    9:48 AM 12/12/2020    9:37 AM 12/07/2019   11:26 AM 10/30/2015    3:30 PM  Advanced Directives  Does Patient Have a Medical Advance Directive? No Yes No No No   Does patient want to make changes to medical advance directive?  Yes (MAU/Ambulatory/Procedural Areas - Information given)     Would patient like information on  creating a medical advance directive? No - Patient declined  Yes (MAU/Ambulatory/Procedural Areas - Information given) Yes (MAU/Ambulatory/Procedural Areas - Information given) No - patient declined information      Data saved with a previous flowsheet row definition    Current Medications (verified) Outpatient Encounter Medications as of 03/29/2024  Medication Sig   atorvastatin  (LIPITOR) 20 MG tablet TAKE 1 TABLET BY MOUTH EVERY DAY   LORazepam  (ATIVAN ) 0.5 MG tablet Take 1 tablet (0.5 mg total) by mouth 2 (two) times daily as needed for anxiety.   Melatonin 10 MG CAPS Take by mouth.   mometasone (ELOCON) 0.1 % ointment SMARTSIG:1 Application In Ear(s) Twice Daily   Multiple Vitamin (MULTI-DAY VITAMINS PO) Take by mouth as needed.   omeprazole  (PRILOSEC) 20 MG capsule TAKE 1 CAPSULE BY MOUTH EVERY DAY   tadalafil  (CIALIS ) 20 MG tablet Take 1 tablet (20 mg total) by mouth daily as needed.   valACYclovir  (VALTREX ) 1000 MG tablet Take 1 tablet (1,000 mg total) by mouth daily.   zolpidem  (AMBIEN  CR) 6.25 MG CR tablet Take 1 tablet (6.25 mg total) by mouth at bedtime as needed. for sleep Code G47.00   No facility-administered encounter medications on file as of 03/29/2024.    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  Past Surgical History:  Procedure Laterality Date   COLONOSCOPY     MELANOMA EXCISION     left cheek/face   NOSE SURGERY     Deviated Septum   Family History  Problem Relation Age of Onset   Colon cancer Mother    Esophageal cancer Neg Hx    Rectal cancer Neg Hx    Stomach cancer Neg Hx    Social History   Socioeconomic History   Marital status: Married    Spouse name: Not on  file   Number of children: Not on file   Years of education: Not on file   Highest education level: Professional school degree (e.g., MD, DDS, DVM, JD)  Occupational History   Occupation: Retired  Tobacco Use   Smoking status: Never   Smokeless tobacco: Never  Vaping Use   Vaping status: Never Used  Substance and Sexual Activity   Alcohol use: Yes    Alcohol/week: 3.0 standard drinks of alcohol    Types: 3 Cans of beer per week    Comment: mixture of beer, wine, liquor   Drug use: No   Sexual activity: Not on file  Other Topics Concern   Not on file  Social History Narrative   Single   Gets regular exercise   Social Drivers of Health   Financial Resource Strain: Low Risk  (03/29/2024)   Overall Financial Resource Strain (CARDIA)    Difficulty of Paying Living Expenses: Not hard at all  Food Insecurity: No Food Insecurity (03/29/2024)   Hunger Vital Sign    Worried About Running Out of Food in the Last Year: Never true    Ran Out of Food in the Last Year: Never true  Transportation Needs: No Transportation Needs (03/29/2024)   PRAPARE - Administrator, Civil Service (Medical): No    Lack of Transportation (Non-Medical): No  Physical Activity: Sufficiently Active (03/29/2024)   Exercise Vital Sign    Days of Exercise per Week: 7 days    Minutes of Exercise per Session: 120 min  Stress: No Stress Concern Present (03/29/2024)   [employer] of Occupational Health - Occupational Stress Questionnaire    Feeling of Stress: Not at all  Recent Concern: Stress - Stress Concern Present (03/22/2024)   [employer] of Occupational Health - Occupational Stress Questionnaire    Feeling of Stress: To some extent  Social Connections: Socially Integrated (03/29/2024)   Social Connection and Isolation Panel    Frequency of Communication with Friends and Family: More than three times a week    Frequency of Social Gatherings with Friends and Family: More than three  times a week    Attends Religious Services: 1 to 4 times per year    Active Member of [employer] or Organizations: Yes    Attends Banker Meetings: 1 to 4 times per year    Marital Status: Married    Tobacco Counseling Counseling given: Not Answered    Clinical Intake:  Pre-visit preparation completed: Yes  Pain : No/denies pain     BMI - recorded: 24.66 Nutritional Status: BMI of 19-24  Normal Diabetes: No  Lab Results  Component Value Date   HGBA1C 5.7 03/23/2024   HGBA1C 5.6 03/08/2023   HGBA1C 5.4 12/22/2021     How often do you need to have someone help you when you read instructions, pamphlets, or other written materials from your doctor or pharmacy?: 1 - Never  Interpreter Needed?: No  Information entered by ::  Ellouise Haws, LPN   Activities of Daily Living     03/28/2024    3:49 PM  In your present state of health, do you have any difficulty performing the following activities:  Hearing? 0  Vision? 0  Difficulty concentrating or making decisions? 0  Walking or climbing stairs? 0  Dressing or bathing? 0  Doing errands, shopping? 0  Preparing Food and eating ? N  Using the Toilet? N  In the past six months, have you accidently leaked urine? N  Do you have problems with loss of bowel control? N  Managing your Medications? N  Managing your Finances? N  Housekeeping or managing your Housekeeping? N    Patient Care Team: Kennyth Worth HERO, MD as PCP - General (Family Medicine)  I have updated your Care Teams any recent Medical Services you may have received from other providers in the past year.     Assessment:   This is a routine wellness examination for Charles.  Hearing/Vision screen Hearing Screening - Comments:: Pt denies any hearing issues  Vision Screening - Comments:: Wears rx glasses - up to date with routine eye exams with [facility] [place] Dr HERO   Goals Addressed             This Visit's Progress    Patient Stated        Drink more water        Depression Screen     03/29/2024    1:12 PM 03/23/2024   11:05 AM 04/07/2022   10:54 AM 12/25/2021    9:47 AM 12/22/2021   10:42 AM 03/27/2021   10:36 AM 12/12/2020    9:36 AM  PHQ 2/9 Scores  PHQ - 2 Score 0 0 0 0 0 0 0    Fall Risk     03/29/2024    1:13 PM 03/28/2024    3:49 PM 03/23/2024   11:05 AM 04/07/2022   11:00 AM 12/25/2021    9:48 AM  Fall Risk   Falls in the past year? 0 0 0 0 0  Number falls in past yr: 0    0  Injury with Fall? 0  0 0 0  Risk for fall due to : No Fall Risks  No Fall Risks No Fall Risks   Follow up Falls prevention discussed    Falls prevention discussed      Data saved with a previous flowsheet row definition    MEDICARE RISK AT HOME:  Medicare Risk at Home Any stairs in or around the home?: (Patient-Rptd) Yes If so, are there any without handrails?: (Patient-Rptd) No Home free of loose throw rugs in walkways, pet beds, electrical cords, etc?: (Patient-Rptd) Yes Adequate lighting in your home to reduce risk of falls?: (Patient-Rptd) Yes Life alert?: (Patient-Rptd) No Use of a cane, walker or w/c?: (Patient-Rptd) No Grab bars in the bathroom?: (Patient-Rptd) No Shower chair or bench in shower?: (Patient-Rptd) Yes Elevated toilet seat or a handicapped toilet?: (Patient-Rptd) No  TIMED UP AND GO:  Was the test performed?  No  Cognitive Function: 6CIT completed        03/29/2024    1:14 PM 12/25/2021    9:50 AM 12/12/2020    9:42 AM 12/07/2019   11:33 AM  6CIT Screen  What Year? 0 points 0 points 0 points   What month? 0 points 0 points 0 points   What time? 0 points 0 points 0 points   Count back from  20 0 points 0 points 0 points 0 points  Months in reverse 0 points 0 points 0 points 0 points  Repeat phrase 0 points 0 points 0 points 0 points  Total Score 0 points 0 points 0 points     Immunizations Immunization History  Administered Date(s) Administered   Fluad Quad(high Dose 65+) 02/10/2022    Fluad Trivalent(High Dose 65+) 03/08/2023   INFLUENZA, HIGH DOSE SEASONAL PF 04/21/2017, 04/25/2018, 02/29/2020, 03/23/2024   Influenza Split 03/08/2012   Influenza,inj,Quad PF,6+ Mos 02/16/2019   Influenza-Unspecified 02/25/2021   PFIZER Comirnaty(Gray Top)Covid-19 Tri-Sucrose Vaccine 09/16/2020   PFIZER(Purple Top)SARS-COV-2 Vaccination 06/28/2019, 07/17/2019, 03/28/2020, 09/16/2020   Pfizer Covid-19 Vaccine Bivalent Booster 41yrs & up 03/28/2021   Pfizer(Comirnaty)Fall Seasonal Vaccine 12 years and older 06/10/2023   Pneumococcal Conjugate-13 11/10/2017   Pneumococcal Polysaccharide-23 03/24/2019   Td 02/06/1998, 12/28/2008, 12/06/2020   Zoster Recombinant(Shingrix) 02/16/2022, 02/03/2023   Zoster, Live 04/23/2015, 10/05/2015    Screening Tests Health Maintenance  Topic Date Due   COVID-19 Vaccine (8 - 2025-26 season) 04/08/2024 (Originally 02/07/2024)   Medicare Annual Wellness (AWV)  03/29/2025   Colonoscopy  05/21/2026   DTaP/Tdap/Td (4 - Tdap) 12/07/2030   Pneumococcal Vaccine: 50+ Years  Completed   Influenza Vaccine  Completed   Hepatitis C Screening  Completed   Zoster Vaccines- Shingrix  Completed   Meningococcal B Vaccine  Aged Out    Health Maintenance Items Addressed: See Nurse Notes at the end of this note  Additional Screening:  Vision Screening: Recommended annual ophthalmology exams for early detection of glaucoma and other disorders of the eye. Is the patient up to date with their annual eye exam?  Yes  Who is the provider or what is the name of the office in which the patient attends annual eye exams? [facility] [place]  Dental Screening: Recommended annual dental exams for proper oral hygiene  Community Resource Referral / Chronic Care Management: CRR required this visit?  No   CCM required this visit?  No   Plan:    I have personally reviewed and noted the following in the patient's chart:   Medical and social history Use of  alcohol, tobacco or illicit drugs  Current medications and supplements including opioid prescriptions. Patient is not currently taking opioid prescriptions. Functional ability and status Nutritional status Physical activity Advanced directives List of other physicians Hospitalizations, surgeries, and ER visits in previous 12 months Vitals Screenings to include cognitive, depression, and falls Referrals and appointments  In addition, I have reviewed and discussed with patient certain preventive protocols, quality metrics, and best practice recommendations. A written personalized care plan for preventive services as well as general preventive health recommendations were provided to patient.   Ellouise VEAR Haws, LPN   [id number]   After Visit Summary: (MyChart) Due to this being a telephonic visit, the after visit summary with patients personalized plan was offered to patient via MyChart   Notes: Nothing significant to report at this time.

## 2024-03-30 DIAGNOSIS — L812 Freckles: Secondary | ICD-10-CM | POA: Diagnosis not present

## 2024-03-30 DIAGNOSIS — H61001 Unspecified perichondritis of right external ear: Secondary | ICD-10-CM | POA: Diagnosis not present

## 2024-03-30 DIAGNOSIS — Z85828 Personal history of other malignant neoplasm of skin: Secondary | ICD-10-CM | POA: Diagnosis not present

## 2024-03-30 DIAGNOSIS — D1801 Hemangioma of skin and subcutaneous tissue: Secondary | ICD-10-CM | POA: Diagnosis not present

## 2024-03-30 DIAGNOSIS — L821 Other seborrheic keratosis: Secondary | ICD-10-CM | POA: Diagnosis not present

## 2024-03-30 DIAGNOSIS — L820 Inflamed seborrheic keratosis: Secondary | ICD-10-CM | POA: Diagnosis not present

## 2024-03-30 DIAGNOSIS — L570 Actinic keratosis: Secondary | ICD-10-CM | POA: Diagnosis not present

## 2024-04-02 ENCOUNTER — Other Ambulatory Visit: Payer: Self-pay | Admitting: Family Medicine

## 2024-04-07 ENCOUNTER — Other Ambulatory Visit (INDEPENDENT_AMBULATORY_CARE_PROVIDER_SITE_OTHER)

## 2024-04-07 DIAGNOSIS — R748 Abnormal levels of other serum enzymes: Secondary | ICD-10-CM | POA: Diagnosis not present

## 2024-04-07 LAB — BASIC METABOLIC PANEL WITH GFR
BUN: 19 mg/dL (ref 6–23)
CO2: 29 meq/L (ref 19–32)
Calcium: 9.7 mg/dL (ref 8.4–10.5)
Chloride: 103 meq/L (ref 96–112)
Creatinine, Ser: 1 mg/dL (ref 0.40–1.50)
GFR: 75.09 mL/min (ref >60.00)
Glucose, Bld: 104 mg/dL — ABNORMAL HIGH (ref 70–99)
Potassium: 3.8 meq/L (ref 3.5–5.1)
Sodium: 140 meq/L (ref 135–145)

## 2024-04-11 ENCOUNTER — Ambulatory Visit: Payer: Self-pay | Admitting: Family Medicine

## 2024-04-11 NOTE — Progress Notes (Signed)
 BUN is much better.  He should continue to make sure that he is getting plenty of fluids and we can recheck again in a year.

## 2024-04-26 ENCOUNTER — Other Ambulatory Visit: Payer: Self-pay | Admitting: Family Medicine

## 2025-03-26 ENCOUNTER — Encounter: Admitting: Family Medicine

## 2025-04-04 ENCOUNTER — Ambulatory Visit
